# Patient Record
Sex: Male | Born: 1950 | Race: White | Hispanic: No | Smoking: Never smoker
Health system: Southern US, Community
[De-identification: ages and names within clinical notes are randomized; demographics above are authoritative.]

## PROBLEM LIST (undated history)

## (undated) DIAGNOSIS — T7840XA Allergy, unspecified, initial encounter: Secondary | ICD-10-CM

## (undated) DIAGNOSIS — J45909 Unspecified asthma, uncomplicated: Secondary | ICD-10-CM

## (undated) DIAGNOSIS — C4490 Unspecified malignant neoplasm of skin, unspecified: Secondary | ICD-10-CM

## (undated) DIAGNOSIS — F419 Anxiety disorder, unspecified: Secondary | ICD-10-CM

## (undated) DIAGNOSIS — Z5189 Encounter for other specified aftercare: Secondary | ICD-10-CM

## (undated) DIAGNOSIS — H269 Unspecified cataract: Secondary | ICD-10-CM

## (undated) HISTORY — DX: Anxiety disorder, unspecified: F41.9

## (undated) HISTORY — DX: Unspecified malignant neoplasm of skin, unspecified: C44.90

## (undated) HISTORY — DX: Encounter for other specified aftercare: Z51.89

## (undated) HISTORY — PX: SPINE SURGERY: SHX786

## (undated) HISTORY — DX: Unspecified cataract: H26.9

## (undated) HISTORY — DX: Allergy, unspecified, initial encounter: T78.40XA

## (undated) HISTORY — DX: Unspecified asthma, uncomplicated: J45.909

---

## 1992-02-05 HISTORY — PX: MOHS SURGERY: SUR867

## 2000-09-04 ENCOUNTER — Emergency Department (HOSPITAL_COMMUNITY): Admission: EM | Admit: 2000-09-04 | Discharge: 2000-09-04 | Payer: Self-pay | Admitting: Emergency Medicine

## 2000-09-04 ENCOUNTER — Encounter: Payer: Self-pay | Admitting: Emergency Medicine

## 2001-02-12 ENCOUNTER — Encounter: Admission: RE | Admit: 2001-02-12 | Discharge: 2001-02-12 | Payer: Self-pay | Admitting: Internal Medicine

## 2001-05-18 ENCOUNTER — Ambulatory Visit (HOSPITAL_COMMUNITY): Admission: RE | Admit: 2001-05-18 | Discharge: 2001-05-18 | Payer: Self-pay | Admitting: Internal Medicine

## 2013-09-11 ENCOUNTER — Telehealth: Payer: Self-pay | Admitting: Internal Medicine

## 2013-09-11 NOTE — Telephone Encounter (Signed)
Jason Blevins called and stated that he was a friend of Dr. Laney Pastor, and he wanted to leave his number for him.  His call back number is [phone].

## 2013-10-01 ENCOUNTER — Other Ambulatory Visit: Payer: Self-pay | Admitting: Internal Medicine

## 2013-10-01 ENCOUNTER — Ambulatory Visit (INDEPENDENT_AMBULATORY_CARE_PROVIDER_SITE_OTHER): Payer: Managed Care, Other (non HMO) | Admitting: Internal Medicine

## 2013-10-01 VITALS — BP 124/86 | HR 52 | Temp 98.0°F | Resp 16 | Ht 77.0 in | Wt 167.4 lb

## 2013-10-01 DIAGNOSIS — Z1211 Encounter for screening for malignant neoplasm of colon: Secondary | ICD-10-CM

## 2013-10-01 DIAGNOSIS — Z125 Encounter for screening for malignant neoplasm of prostate: Secondary | ICD-10-CM

## 2013-10-01 DIAGNOSIS — Z13220 Encounter for screening for lipoid disorders: Secondary | ICD-10-CM

## 2013-10-01 DIAGNOSIS — Z23 Encounter for immunization: Secondary | ICD-10-CM

## 2013-10-01 DIAGNOSIS — Z0000 Encounter for general adult medical examination without abnormal findings: Secondary | ICD-10-CM

## 2013-10-01 LAB — IFOBT (OCCULT BLOOD): IFOBT: NEGATIVE

## 2013-10-01 NOTE — Progress Notes (Addendum)
Subjective:  This chart was scribed for Tami Lin, MD, by Neta Ehlers, ED Scribe. This patient's care was started at 6:08 PM.    Patient ID: Jason Blevins, male    DOB: [birth year], 63 y.o.   MRN: [id number]  Chief Complaint  Patient presents with  . Annual Exam   HPI  Jason Blevins is a 63 y.o. male who presents to [facility] requesting an annual exam. He denies an annual exam in the approximately 12 years. He has applied to Medicare. Just moved back from [place].  He endorses intermittent chronic back pain. He had spinal surgery 17 years ago; the surgery was performed by Dr. Cheri Rous.   He also endorses occasional difficulty sleeping. [name] reports he takes melatonin nightly and reports he can fall asleep well, but that he may awaken at 2 or 3 in the morning. He states that he reads until he can return to sleep.   He has not had a colonoscopy. He is unsure of his last prostrate screen. He has not had pneumonia or influenza vaccinations. He is unsure of his tetanus status as well.   [name] reports he and his wife have recently returned to the area from where they had been residing in [place]. He is relocating because of his granddaughters (his wife's son's children) in the area. 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   He reports his son continues to live in [place] and works as a Optometrist in the [employer]. He is obtaining his Master's in [employer] PolicyThe son recently turned thirty-years old.   Past Medical History  Diagnosis Date  . Blood transfusion without reported diagnosis    Past Surgical History  Procedure Laterality Date  . Spine surgery     No current outpatient prescriptions on file prior to visit.   No current facility-administered medications on file prior to visit.   History   Social History  . Marital Status: Married    Spouse Name: N/A    Number of Children: N/A  . Years of Education: N/A   Occupational  History  . Not on file.   Social History Main Topics  . Smoking status: Never Smoker   . Smokeless tobacco: Not on file  . Alcohol Use: Yes     Comment: 2-3  . Drug Use: No  . Sexual Activity: Yes      Review of Systems  Musculoskeletal: Positive for back pain.  Psychiatric/Behavioral: Positive for sleep disturbance.  All other systems reviewed and are negative.     Objective:   Physical Exam  Nursing note and vitals reviewed. Constitutional: He is oriented to person, place, and time. He appears well-developed and well-nourished. No distress.  HENT:  Head: Normocephalic and atraumatic.  Eyes: Conjunctivae and EOM are normal. PERRLA Neck: Neck supple. No thyromegaly or lymphadenopathy Cardiovascular: Normal rate.  Regular rhythm. No murmur. Pulmonary/Chest: Effort normal. No respiratory distress. Lungs clear  Abdomen supple without organomegaly  Rectal without masses, stool Hemoccult negative, and prostate symmetrical and soft.  Range of motion at all major joints. Straight leg raise negative.  Neurological: He is alert and oriented to person, place, and time. Cranial nerves intact. Romberg negative. Skin: Skin is warm and dry.  Psychiatric: He has a normal mood and affect. His behavior is normal.  Vitals: BP 124/86  Pulse 52  Temp(Src) 98 F (36.7 C) (Oral)  Resp 16  Ht 6\' 5"  (1.956 m)  Wt  167 lb 6.4 oz (75.932 kg)  BMI 19.85 kg/m2  SpO2 100%     Assessment & Plan:  I have completed the patient encounter in its entirety as documented by the scribe, with editing by me where necessary. Eily Louvier P. Laney Pastor, M.D. Routine general medical examination at a health care facility - Plan: CBC, Lipid panel, PSA, Comprehensive metabolic panel, IFOBT POC (occult bld, rslt in office), Flu Vaccine QUAD 36+ mos IM, Tdap vaccine greater than or equal to 7yo IM, Pneumococcal conjugate vaccine 13-valent IM  Screening for prostate cancer - Plan: PSA  Screening for colon cancer -  Plan: IFOBT POC (occult bld, rslt in office)  Screening for cholesterol level - Plan: Lipid panel  Need for Tdap vaccination - Plan: Tdap vaccine greater than or equal to 7yo IM  Needs flu shot - Plan: Flu Vaccine QUAD 36+ mos IM   Very healthy in general  Addend 8/31 Results for orders placed in visit on 10/01/13  CBC      Result Value Ref Range   WBC 7.9  4.0 - 10.5 K/uL   RBC 5.37  4.22 - 5.81 MIL/uL   Hemoglobin 15.9  13.0 - 17.0 g/dL   HCT 45.6  39.0 - 52.0 %   MCV 84.9  78.0 - 100.0 fL   MCH 29.6  26.0 - 34.0 pg   MCHC 34.9  30.0 - 36.0 g/dL   RDW 13.5  11.5 - 15.5 %   Platelets 250  150 - 400 K/uL  LIPID PANEL      Result Value Ref Range   Cholesterol 210 (*) 0 - 200 mg/dL   Triglycerides 66  <150 mg/dL   HDL 84  >39 mg/dL   Total CHOL/HDL Ratio 2.5     VLDL 13  0 - 40 mg/dL   LDL Cholesterol 113 (*) 0 - 99 mg/dL  PSA      Result Value Ref Range   PSA    <=4.00 ng/mL  COMPREHENSIVE METABOLIC PANEL      Result Value Ref Range   Sodium 136  135 - 145 mEq/L   Potassium 4.1  3.5 - 5.3 mEq/L   Chloride 100  96 - 112 mEq/L   CO2 30  19 - 32 mEq/L   Glucose, Bld 90  70 - 99 mg/dL   BUN 20  6 - 23 mg/dL   Creat 0.96  0.50 - 1.35 mg/dL   Total Bilirubin 0.5  0.2 - 1.2 mg/dL   Alkaline Phosphatase 82  39 - 117 U/L   AST 43 (*) 0 - 37 U/L   ALT 44  0 - 53 U/L   Total Protein 7.8  6.0 - 8.3 g/dL   Albumin 4.7  3.5 - 5.2 g/dL   Calcium 9.9  8.4 - 10.5 mg/dL  IFOBT (OCCULT BLOOD)      Result Value Ref Range   IFOBT Negative     Will add hep c ab Refer for routine colonoscopy next year

## 2013-10-02 LAB — LIPID PANEL
Cholesterol: 210 mg/dL — ABNORMAL HIGH (ref 0–200)
HDL: 84 mg/dL (ref >39)
LDL Cholesterol: 113 mg/dL — ABNORMAL HIGH (ref 0–99)
Total CHOL/HDL Ratio: 2.5 Ratio
Triglycerides: 66 mg/dL (ref <150)
VLDL: 13 mg/dL (ref 0–40)

## 2013-10-02 LAB — COMPREHENSIVE METABOLIC PANEL
ALT: 44 U/L (ref 0–53)
AST: 43 U/L — ABNORMAL HIGH (ref 0–37)
Albumin: 4.7 g/dL (ref 3.5–5.2)
Alkaline Phosphatase: 82 U/L (ref 39–117)
BUN: 20 mg/dL (ref 6–23)
CO2: 30 mEq/L (ref 19–32)
Calcium: 9.9 mg/dL (ref 8.4–10.5)
Chloride: 100 mEq/L (ref 96–112)
Creat: 0.96 mg/dL (ref 0.50–1.35)
Glucose, Bld: 90 mg/dL (ref 70–99)
Potassium: 4.1 mEq/L (ref 3.5–5.3)
Sodium: 136 mEq/L (ref 135–145)
Total Bilirubin: 0.5 mg/dL (ref 0.2–1.2)
Total Protein: 7.8 g/dL (ref 6.0–8.3)

## 2013-10-02 LAB — CBC
HCT: 45.6 % (ref 39.0–52.0)
Hemoglobin: 15.9 g/dL (ref 13.0–17.0)
MCH: 29.6 pg (ref 26.0–34.0)
MCHC: 34.9 g/dL (ref 30.0–36.0)
MCV: 84.9 fL (ref 78.0–100.0)
Platelets: 250 10*3/uL (ref 150–400)
RBC: 5.37 MIL/uL (ref 4.22–5.81)
RDW: 13.5 % (ref 11.5–15.5)
WBC: 7.9 10*3/uL (ref 4.0–10.5)

## 2013-10-04 LAB — PSA: PSA: 0.85 ng/mL (ref <=4.00)

## 2013-10-05 LAB — HEPATITIS C ANTIBODY: HCV AB: NEGATIVE

## 2013-10-06 ENCOUNTER — Encounter: Payer: Self-pay | Admitting: Internal Medicine

## 2014-01-12 DIAGNOSIS — R5383 Other fatigue: Secondary | ICD-10-CM | POA: Diagnosis not present

## 2014-10-21 ENCOUNTER — Ambulatory Visit (INDEPENDENT_AMBULATORY_CARE_PROVIDER_SITE_OTHER): Payer: Medicare Other | Admitting: Internal Medicine

## 2014-10-21 ENCOUNTER — Encounter: Payer: Self-pay | Admitting: Internal Medicine

## 2014-10-21 VITALS — BP 105/69 | HR 59 | Temp 98.6°F | Resp 16 | Ht 77.0 in | Wt 172.0 lb

## 2014-10-21 DIAGNOSIS — Z23 Encounter for immunization: Secondary | ICD-10-CM | POA: Diagnosis not present

## 2014-10-21 DIAGNOSIS — B070 Plantar wart: Secondary | ICD-10-CM

## 2014-10-21 DIAGNOSIS — L299 Pruritus, unspecified: Secondary | ICD-10-CM

## 2014-10-21 MED ORDER — ZOSTER VACCINE LIVE 19400 UNT/0.65ML ~~LOC~~ SOLR: 0.6500 mL | Freq: Once | SUBCUTANEOUS | Status: DC

## 2014-10-21 MED ORDER — IMIQUIMOD 5 % EX CREA: TOPICAL_CREAM | CUTANEOUS | Status: DC

## 2014-10-23 NOTE — Progress Notes (Signed)
   Subjective:    Patient ID: Jason Blevins, male    DOB: 1950/05/29, 64 y.o.   MRN: [id number]  HPI 2 problems #1 pruritus of right upper arm. This comes and goes without obvious skin lesions or without the same factors. Present for at least a year. PH will be deep with intense for several days. There are aspects of this itch that resemble a tingling. He describes no other symptoms of cervical radiculopathy or skin conditions.  #2 plantar wart on foot that has not responded to Compound W although he did not shave dead tissue  Health maintenance issues while he is here include influenza and pneumococcal vaccines and a prescription for shingles vaccine  Review of Systems Noncontributory    Objective:   Physical Exam BP 105/69 mmHg  Pulse 59  Temp(Src) 98.6 F (37 C)  Resp 16  Ht 6\' 5"  (1.956 m)  Wt 172 lb (78.019 kg)  BMI 20.39 kg/m2 There are no skin lesions on the right upper arm and no significant dryness. He does have some tightness and tenderness in his right trapezius in his right paracervical some tension on forced flexion. Reflexes are intact as is motor and sensory exam of the upper extremity     Assessment & Plan:  Problem #1 pruritus-I think he actually has cervical radiculopathy is mild and causes his right upper arm symptoms He will approach with physical therapy\  Problem #2 plantar wart Trial of Aldara 3 times a week for 12 weeks Shave dead tissue between applications   #3 immunizations

## 2015-01-25 ENCOUNTER — Ambulatory Visit (INDEPENDENT_AMBULATORY_CARE_PROVIDER_SITE_OTHER): Payer: Medicare Other | Admitting: Internal Medicine

## 2015-01-25 ENCOUNTER — Encounter: Payer: Self-pay | Admitting: Internal Medicine

## 2015-01-25 VITALS — BP 101/64 | HR 63 | Temp 98.7°F | Resp 16 | Ht 77.0 in | Wt 169.0 lb

## 2015-01-25 DIAGNOSIS — J0100 Acute maxillary sinusitis, unspecified: Secondary | ICD-10-CM

## 2015-01-25 DIAGNOSIS — B070 Plantar wart: Secondary | ICD-10-CM

## 2015-01-25 MED ORDER — AMOXICILLIN 875 MG PO TABS: 875.0000 mg | ORAL_TABLET | Freq: Two times a day (BID) | ORAL | Status: DC

## 2015-01-25 MED ORDER — PREDNISONE 20 MG PO TABS: ORAL_TABLET | ORAL | Status: DC

## 2015-01-25 MED ORDER — HYDROCODONE-ACETAMINOPHEN 5-325 MG PO TABS: 1.0000 | ORAL_TABLET | Freq: Four times a day (QID) | ORAL | Status: DC | PRN

## 2015-01-25 MED ORDER — IMIQUIMOD 5 % EX CREA: TOPICAL_CREAM | CUTANEOUS | Status: DC

## 2015-01-25 NOTE — Progress Notes (Signed)
   Subjective:    Patient ID: Jason Blevins, male    DOB: 05/08/1950, 64 y.o.   MRN: [id number]  HPI  Chief Complaint  Patient presents with  . Follow-up  . Plantar Warts---see last ov improving on aldara at 86mos  . Cough  . Nasal Congestion  Uri sxt 2 mos //worse last week w/ chills ?fever and ourulent d/c //sl cough  Also c/o occas back pain post surgery --still playing BB Uses HC about 1-2x per month when very uncomf   Review of Systems    [place] Objective:   Physical Exam  BP 101/64 mmHg  Pulse 63  Temp(Src) 98.7 F (37.1 C)  Resp 16  Ht 6\' 5"  (1.956 m)  Wt 169 lb (76.658 kg)  BMI 20.04 kg/m2 HEENT--purulent sinus d/c thr clear No nodes Chest clear x sl wheeze with FExpir ant(hx childhood asthma) PL wart R foot smaller!      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 Hydrocodone-Acetaminophen 5-300 MG TABS    Sig: Take by mouth.  . imiquimod (ALDARA) 5 % cream    Sig: Apply topically 3 (three) times a week.    Dispense:  12 each    Refill:  2  . amoxicillin (AMOXIL) 875 MG tablet    Sig: Take 1 tablet (875 mg total) by mouth 2 (two) times daily.    Dispense:  20 tablet    Refill:  0  . predniSONE (DELTASONE) 20 MG tablet    Sig: 3/3/2/2/1/1 single daily dose for 6 days    Dispense:  12 tablet    Refill:  0  . HYDROcodone-acetaminophen (NORCO/VICODIN) 5-325 MG tablet    Sig: Take 1 tablet by mouth every 6 (six) hours as needed for moderate pain.    Dispense:  90 tablet    Refill:  0

## 2015-01-25 NOTE — Patient Instructions (Signed)
Tumeric capsules Cherry extract Glucosamine

## 2015-03-27 DIAGNOSIS — H1013 Acute atopic conjunctivitis, bilateral: Secondary | ICD-10-CM | POA: Diagnosis not present

## 2015-03-28 DIAGNOSIS — H1013 Acute atopic conjunctivitis, bilateral: Secondary | ICD-10-CM | POA: Diagnosis not present

## 2015-08-22 ENCOUNTER — Telehealth: Payer: Self-pay

## 2015-08-22 NOTE — Telephone Encounter (Signed)
Patient needs referrral to  [facility]  [phone]

## 2015-08-22 NOTE — Telephone Encounter (Signed)
Informed pt he needs to be seen for referral, pt has not been seen sine 2016

## 2015-08-23 ENCOUNTER — Ambulatory Visit (INDEPENDENT_AMBULATORY_CARE_PROVIDER_SITE_OTHER): Payer: Medicare Other | Admitting: Emergency Medicine

## 2015-08-23 VITALS — BP 118/82 | HR 53 | Temp 97.8°F | Resp 18 | Ht 77.0 in | Wt 168.0 lb

## 2015-08-23 DIAGNOSIS — H9193 Unspecified hearing loss, bilateral: Secondary | ICD-10-CM

## 2015-08-23 NOTE — Patient Instructions (Addendum)
IF you received an x-ray today, you will receive an invoice from [facility]. Please contact [facility] at [phone] with questions or concerns regarding your invoice.   IF you received labwork today, you will receive an invoice from [employer]. Please contact [facility] at [phone] with questions or concerns regarding your invoice.   Our billing staff will not be able to assist you with questions regarding bills from these companies.  You will be contacted with the lab results as soon as they are available. The fastest way to get your results is to activate your My Chart account. Instructions are located on the last page of this paperwork. If you have not heard from [place] regarding the results in 2 weeks, please contact this office.     Hearing Loss Hearing loss is a partial or total loss of the ability to hear. This can be temporary or permanent, and it can happen in one or both ears. Hearing loss may be referred to as deafness. Medical care is necessary to treat hearing loss properly and to prevent the condition from getting worse. Your hearing may partially or completely come back, depending on what caused your hearing loss and how severe it is. In some cases, hearing loss is permanent. CAUSES Common causes of hearing loss include:   Too much wax in the ear canal.   Infection of the ear canal or middle ear.   Fluid in the middle ear.   Injury to the ear or surrounding area.   An object stuck in the ear.   Prolonged exposure to loud sounds, such as music.  Less common causes of hearing loss include:   Tumors in the ear.   Viral or bacterial infections, such as meningitis.   A hole in the eardrum (perforated eardrum).  Problems with the hearing nerve that sends signals between the brain and the ear.  Certain medicines.  SYMPTOMS  Symptoms of this condition may include:  Difficulty telling the difference  between sounds.  Difficulty following a conversation when there is background noise.  Lack of response to sounds in your environment. This may be most noticeable when you do not respond to startling sounds.  Needing to turn up the volume on the television, radio, etc.  Ringing in the ears.  Dizziness.  Pain in the ears. DIAGNOSIS This condition is diagnosed based on a physical exam and a hearing test (audiometry). The audiometry test will be performed by a hearing specialist (audiologist). You may also be referred to an ear, nose, and throat (ENT) specialist (otolaryngologist).  TREATMENT Treatment for recent onset of hearing loss may include:   Ear wax removal.   Being prescribed medicines to prevent infection (antibiotics).   Being prescribed medicines to reduce inflammation (corticosteroids).  HOME CARE INSTRUCTIONS  If you were prescribed an antibiotic medicine, take it as told by your health care provider. Do not stop taking the antibiotic even if you start to feel better.  Take over-the-counter and prescription medicines only as told by your health care provider.  Avoid loud noises.   Return to your normal activities as told by your health care provider. Ask your health care provider what activities are safe for you.  Keep all follow-up visits as told by your health care provider. This is important. SEEK MEDICAL CARE IF:   You feel dizzy.   You develop new symptoms.   You vomit or feel nauseous.   You have a fever.  SEEK IMMEDIATE  MEDICAL CARE IF:  You develop sudden changes in your vision.   You have severe ear pain.   You have new or increased weakness.  You have a severe headache.   This information is not intended to replace advice given to you by your health care provider. Make sure you discuss any questions you have with your health care provider.   Document Released: 01/21/2005 Document Revised: 10/12/2014 Document Reviewed:  06/08/2014 Elsevier Interactive Patient Education [employer].

## 2015-08-23 NOTE — Progress Notes (Signed)
By signing my name below, I, Mesha Guinyard, attest that this documentation has been prepared under the direction and in the presence of Arlyss Queen, MD.  Electronically Signed: Verlee Monte, Medical Scribe. 08/23/2015. 10:16 AM.  Chief Complaint:  Chief Complaint  Patient presents with  . Referral    Ear issue    HPI: Jason Blevins is a 65 y.o. male who reports to [facility] today complaining of hearing loss that's occured over a period of time. Pt noticed the music isn't a sharp as it once was. Pt has a hard time listening to lyrics when he's listening to music. Pt states his wife complains about him not listening when he says he is. Pt listened to a lot of loud music when he was younger. Pt would like to check his hering baseline.  Past Medical History  Diagnosis Date  . Blood transfusion without reported diagnosis    Past Surgical History  Procedure Laterality Date  . Spine surgery     Social History   Social History  . Marital Status: Married    Spouse Name: N/A  . Number of Children: N/A  . Years of Education: N/A   Social History Main Topics  . Smoking status: Never Smoker   . Smokeless tobacco: None  . Alcohol Use: Yes     Comment: 2-3  . Drug Use: No  . Sexual Activity: Yes   Other Topics Concern  . None   Social History Narrative   Family History  Problem Relation Age of Onset  . Cancer Mother   . Heart disease Maternal Grandfather    No Known Allergies Prior to Admission medications   Medication Sig Start Date End Date Taking? Authorizing Provider  HYDROcodone-acetaminophen (NORCO/VICODIN) 5-325 MG tablet Take 1 tablet by mouth every 6 (six) hours as needed for moderate pain. Patient not taking: Reported on 08/23/2015 01/25/15   Leandrew Koyanagi, MD  Hydrocodone-Acetaminophen 5-300 MG TABS Take by mouth. Reported on 08/23/2015    Historical Provider, MD  predniSONE (DELTASONE) 20 MG tablet 3/3/2/2/1/1 single daily dose for 6 days Patient not taking:  Reported on 08/23/2015 01/25/15   Leandrew Koyanagi, MD     ROS: The patient denies fevers, chills, night sweats, unintentional weight loss, chest pain, palpitations, wheezing, dyspnea on exertion, nausea, vomiting, abdominal pain, dysuria, hematuria, melena, numbness, weakness, or tingling.  All other systems have been reviewed and were otherwise negative with the exception of those mentioned in the HPI and as above.    PHYSICAL EXAM: Filed Vitals:   08/23/15 1008  BP: 118/82  Pulse: 53  Temp: 97.8 F (36.6 C)  Resp: 18   Body mass index is 19.92 kg/(m^2).   General: Alert, no acute distress HEENT:  Normocephalic, atraumatic, oropharynx patent. Eye: Juliette Mangle [facility] Cardiovascular:  Regular rate and rhythm, no rubs murmurs or gallops.  No Carotid bruits, radial pulse intact. No pedal edema.  Respiratory: Clear to auscultation bilaterally.  No wheezes, rales, or rhonchi.  No cyanosis, no use of accessory musculature Abdominal: No organomegaly, abdomen is soft and non-tender, positive bowel sounds.  No masses. Musculoskeletal: Gait intact. No edema, tenderness Skin: No rashes. Neurologic: Facial musculature symmetric. Psychiatric: Patient acts appropriately throughout our interaction. Lymphatic: No cervical or submandibular lymphadenopathy  LABS:  EKG/XRAY:   Primary read interpreted by [facility]. Everlene Farrier at [facility]   ASSESSMENT/PLAN: Patient has had a gradual decline in hearing according to light. Referral made to Dr. Elwyn Reach for audiological evaluation.I personally performed the services  described in this documentation, which was scribed in my presence. The recorded information has been reviewed and is accurate.   Gross sideeffects, risk and benefits, and alternatives of medications d/w patient. Patient is aware that all medications have potential sideeffects and we are unable to predict every sideeffect or drug-drug interaction that may occur.  Arlyss Queen MD 08/23/2015 10:16  AM

## 2015-09-18 DIAGNOSIS — H903 Sensorineural hearing loss, bilateral: Secondary | ICD-10-CM | POA: Diagnosis not present

## 2016-06-27 DIAGNOSIS — H2513 Age-related nuclear cataract, bilateral: Secondary | ICD-10-CM | POA: Diagnosis not present

## 2016-07-03 ENCOUNTER — Ambulatory Visit (INDEPENDENT_AMBULATORY_CARE_PROVIDER_SITE_OTHER): Payer: Medicare Other | Admitting: Emergency Medicine

## 2016-07-03 ENCOUNTER — Encounter: Payer: Self-pay | Admitting: Emergency Medicine

## 2016-07-03 VITALS — BP 128/73 | HR 60 | Temp 98.6°F | Resp 18 | Ht 71.26 in | Wt 169.0 lb

## 2016-07-03 DIAGNOSIS — M545 Low back pain: Secondary | ICD-10-CM

## 2016-07-03 DIAGNOSIS — G8929 Other chronic pain: Secondary | ICD-10-CM

## 2016-07-03 MED ORDER — HYDROCODONE-ACETAMINOPHEN 5-325 MG PO TABS: 1.0000 | ORAL_TABLET | Freq: Four times a day (QID) | ORAL | 0 refills | Status: DC | PRN

## 2016-07-03 NOTE — Patient Instructions (Addendum)
     IF you received an x-ray today, you will receive an invoice from [facility]. Please contact [facility] at [phone] with questions or concerns regarding your invoice.   IF you received labwork today, you will receive an invoice from [place]. Please contact [facility] at [phone] with questions or concerns regarding your invoice.   Our billing staff will not be able to assist you with questions regarding bills from these companies.  You will be contacted with the lab results as soon as they are available. The fastest way to get your results is to activate your My Chart account. Instructions are located on the last page of this paperwork. If you have not heard from [place] regarding the results in 2 weeks, please contact this office.      Back Pain, Adult Back pain is very common. The pain often gets better over time. The cause of back pain is usually not dangerous. Most people can learn to manage their back pain on their own. Follow these instructions at home: Watch your back pain for any changes. The following actions may help to lessen any pain you are feeling:  Stay active. Start with short walks on flat ground if you can. Try to walk farther each day.  Exercise regularly as told by your doctor. Exercise helps your back heal faster. It also helps avoid future injury by keeping your muscles strong and flexible.  Do not sit, drive, or stand in one place for more than 30 minutes.  Do not stay in bed. Resting more than 1-2 days can slow down your recovery.  Be careful when you bend or lift an object. Use good form when lifting:  Bend at your knees.  Keep the object close to your body.  Do not twist.  Sleep on a firm mattress. Lie on your side, and bend your knees. If you lie on your back, put a pillow under your knees.  Take medicines only as told by your doctor.  Put ice on the injured area.  Put ice in a plastic bag.  Place a towel between your  skin and the bag.  Leave the ice on for 20 minutes, 2-3 times a day for the first 2-3 days. After that, you can switch between ice and heat packs.  Avoid feeling anxious or stressed. Find good ways to deal with stress, such as exercise.  Maintain a healthy weight. Extra weight puts stress on your back. Contact a doctor if:  You have pain that does not go away with rest or medicine.  You have worsening pain that goes down into your legs or buttocks.  You have pain that does not get better in one week.  You have pain at night.  You lose weight.  You have a fever or chills. Get help right away if:  You cannot control when you poop (bowel movement) or pee (urinate).  Your arms or legs feel weak.  Your arms or legs lose feeling (numbness).  You feel sick to your stomach (nauseous) or throw up (vomit).  You have belly (abdominal) pain.  You feel like you may pass out (faint). This information is not intended to replace advice given to you by your health care provider. Make sure you discuss any questions you have with your health care provider. Document Released: 07/10/2007 Document Revised: 06/29/2015 Document Reviewed: 05/25/2013 Elsevier Interactive Patient Education  2017 [employer].

## 2016-07-03 NOTE — Progress Notes (Signed)
Jason Blevins 66 y.o.   Chief Complaint  Patient presents with  . Follow-up    Back pain    HISTORY OF PRESENT ILLNESS: This is a 66 y.o. male complaining of chronic back pain that occassionally flares up; plays basketball regularly; sometimes he takes Norco but not regularly. Denies neurological symptoms.  Back Pain  This is a chronic problem. The current episode started in the past 7 days. The problem occurs intermittently. The problem has been waxing and waning since onset. The pain is present in the lumbar spine. The quality of the pain is described as aching. The pain does not radiate. The pain is at a severity of 5/10. The pain is moderate. The symptoms are aggravated by bending and position. Pertinent negatives include no abdominal pain, bladder incontinence, bowel incontinence, chest pain, dysuria, fever, headaches, leg pain, numbness, paresis, paresthesias, pelvic pain, perianal numbness, tingling, weakness or weight loss. Risk factors: none.     Prior to Admission medications   Medication Sig Start Date End Date Taking? Authorizing Provider  HYDROcodone-acetaminophen (NORCO/VICODIN) 5-325 MG tablet Take 1 tablet by mouth every 6 (six) hours as needed for moderate pain. 01/25/15  Yes Leandrew Koyanagi, MD  predniSONE (DELTASONE) 20 MG tablet 3/3/2/2/1/1 single daily dose for 6 days 01/25/15  Yes Leandrew Koyanagi, MD    No Known Allergies  There are no active problems to display for this patient.   Past Medical History:  Diagnosis Date  . Blood transfusion without reported diagnosis     Past Surgical History:  Procedure Laterality Date  . SPINE SURGERY      Social History   Social History  . Marital status: Married    Spouse name: N/A  . Number of children: N/A  . Years of education: N/A   Occupational History  . Not on file.   Social History Main Topics  . Smoking status: Never Smoker  . Smokeless tobacco: Never Used  . Alcohol use Yes     Comment:  2-3  . Drug use: No  . Sexual activity: Yes   Other Topics Concern  . Not on file   Social History Narrative  . No narrative on file    Family History  Problem Relation Age of Onset  . Cancer Mother   . Heart disease Maternal Grandfather      Review of Systems  Constitutional: Negative for fever and weight loss.  HENT: Positive for hearing loss. Negative for congestion, ear pain, nosebleeds, sinus pain and sore throat.   Eyes: Negative for blurred vision, double vision, discharge and redness.  Respiratory: Negative for cough, hemoptysis and shortness of breath.   Cardiovascular: Negative for chest pain, palpitations and leg swelling.  Gastrointestinal: Negative for abdominal pain, bowel incontinence, diarrhea, nausea and vomiting.  Genitourinary: Negative for bladder incontinence, dysuria and pelvic pain.  Musculoskeletal: Positive for back pain.  Skin: Negative for rash.  Neurological: Positive for dizziness (intermittent). Negative for tingling, sensory change, speech change, focal weakness, seizures, loss of consciousness, weakness, numbness, headaches and paresthesias.  Endo/Heme/Allergies: Negative.   All other systems reviewed and are negative.  Vitals:   07/03/16 0814  BP: 128/73  Pulse: 60  Resp: 18  Temp: 98.6 F (37 C)     Physical Exam  Constitutional: He is oriented to person, place, and time. He appears well-developed and well-nourished.  HENT:  Head: Normocephalic and atraumatic.  Right Ear: Tympanic membrane, external ear and ear canal normal.  Left Ear: Tympanic membrane,  external ear and ear canal normal.  Nose: Nose normal.  Mouth/Throat: Oropharynx is clear and moist. No oropharyngeal exudate.  Eyes: Conjunctivae and EOM are normal. Pupils are equal, round, and reactive to light.  Neck: Normal range of motion. Neck supple. No JVD present. No thyromegaly present.  Cardiovascular: Normal rate, regular rhythm, normal heart sounds and intact distal  pulses.   Pulmonary/Chest: Effort normal and breath sounds normal.  Abdominal: Soft. Bowel sounds are normal. There is no tenderness.  Musculoskeletal: Normal range of motion.  Lymphadenopathy:    He has no cervical adenopathy.  Neurological: He is alert and oriented to person, place, and time. No sensory deficit. He exhibits normal muscle tone.  Skin: Skin is warm and dry. Capillary refill takes less than 2 seconds. No rash noted.  Psychiatric: He has a normal mood and affect. His behavior is normal.  Vitals reviewed.    ASSESSMENT & PLAN: Jason Blevins was seen today for follow-up.  Diagnoses and all orders for this visit:  Chronic right-sided low back pain without sciatica  Other orders -     HYDROcodone-acetaminophen (NORCO) 5-325 MG tablet; Take 1 tablet by mouth every 6 (six) hours as needed for moderate pain.    Patient Instructions       IF you received an x-ray today, you will receive an invoice from [facility]. Please contact [facility] at [phone] with questions or concerns regarding your invoice.   IF you received labwork today, you will receive an invoice from [facility]. Please contact [facility] at [phone] with questions or concerns regarding your invoice.   Our billing staff will not be able to assist you with questions regarding bills from these companies.  You will be contacted with the lab results as soon as they are available. The fastest way to get your results is to activate your My Chart account. Instructions are located on the last page of this paperwork. If you have not heard from [place] regarding the results in 2 weeks, please contact this office.      Back Pain, Adult Back pain is very common. The pain often gets better over time. The cause of back pain is usually not dangerous. Most people can learn to manage their back pain on their own. Follow these instructions at home: Watch your back pain for any changes. The following actions  may help to lessen any pain you are feeling:  Stay active. Start with short walks on flat ground if you can. Try to walk farther each day.  Exercise regularly as told by your doctor. Exercise helps your back heal faster. It also helps avoid future injury by keeping your muscles strong and flexible.  Do not sit, drive, or stand in one place for more than 30 minutes.  Do not stay in bed. Resting more than 1-2 days can slow down your recovery.  Be careful when you bend or lift an object. Use good form when lifting:  Bend at your knees.  Keep the object close to your body.  Do not twist.  Sleep on a firm mattress. Lie on your side, and bend your knees. If you lie on your back, put a pillow under your knees.  Take medicines only as told by your doctor.  Put ice on the injured area.  Put ice in a plastic bag.  Place a towel between your skin and the bag.  Leave the ice on for 20 minutes, 2-3 times a day for the first 2-3 days. After that, you  can switch between ice and heat packs.  Avoid feeling anxious or stressed. Find good ways to deal with stress, such as exercise.  Maintain a healthy weight. Extra weight puts stress on your back. Contact a doctor if:  You have pain that does not go away with rest or medicine.  You have worsening pain that goes down into your legs or buttocks.  You have pain that does not get better in one week.  You have pain at night.  You lose weight.  You have a fever or chills. Get help right away if:  You cannot control when you poop (bowel movement) or pee (urinate).  Your arms or legs feel weak.  Your arms or legs lose feeling (numbness).  You feel sick to your stomach (nauseous) or throw up (vomit).  You have belly (abdominal) pain.  You feel like you may pass out (faint). This information is not intended to replace advice given to you by your health care provider. Make sure you discuss any questions you have with your health care  provider. Document Released: 07/10/2007 Document Revised: 06/29/2015 Document Reviewed: 05/25/2013 Elsevier Interactive Patient Education  2017 Elsevier Inc.      Agustina Caroli, MD [facility]

## 2016-08-06 ENCOUNTER — Encounter: Payer: Self-pay | Admitting: Emergency Medicine

## 2016-08-06 ENCOUNTER — Ambulatory Visit (INDEPENDENT_AMBULATORY_CARE_PROVIDER_SITE_OTHER): Payer: Medicare Other | Admitting: Emergency Medicine

## 2016-08-06 VITALS — BP 119/78 | HR 58 | Temp 98.2°F | Resp 18 | Ht 71.93 in | Wt 170.2 lb

## 2016-08-06 DIAGNOSIS — W57XXXA Bitten or stung by nonvenomous insect and other nonvenomous arthropods, initial encounter: Secondary | ICD-10-CM | POA: Diagnosis not present

## 2016-08-06 DIAGNOSIS — L299 Pruritus, unspecified: Secondary | ICD-10-CM | POA: Diagnosis not present

## 2016-08-06 MED ORDER — PREDNISONE 20 MG PO TABS: 40.0000 mg | ORAL_TABLET | Freq: Every day | ORAL | 0 refills | Status: AC

## 2016-08-06 MED ORDER — TRIAMCINOLONE ACETONIDE 0.1 % EX CREA: 1.0000 "application " | TOPICAL_CREAM | Freq: Two times a day (BID) | CUTANEOUS | 0 refills | Status: DC

## 2016-08-06 NOTE — Progress Notes (Signed)
Jason Blevins 66 y.o.   Chief Complaint  Patient presents with  . Insect Bite    X 5 weeks- ithcing    HISTORY OF PRESENT ILLNESS: This is a 66 y.o. male complaining of several insect bites throughout his body x 5 weeks; still itching; no systemic symptoms.  HPI   Prior to Admission medications   Medication Sig Start Date End Date Taking? Authorizing Provider  HYDROcodone-acetaminophen (NORCO) 5-325 MG tablet Take 1 tablet by mouth every 6 (six) hours as needed for moderate pain. 07/03/16  Yes Horald Pollen, MD  predniSONE (DELTASONE) 20 MG tablet 3/3/2/2/1/1 single daily dose for 6 days Patient not taking: Reported on 08/06/2016 01/25/15   Leandrew Koyanagi, MD    No Known Allergies  Patient Active Problem List   Diagnosis Date Noted  . Chronic right-sided low back pain without sciatica 07/03/2016    Past Medical History:  Diagnosis Date  . Blood transfusion without reported diagnosis     Past Surgical History:  Procedure Laterality Date  . SPINE SURGERY      Social History   Social History  . Marital status: Married    Spouse name: N/A  . Number of children: N/A  . Years of education: N/A   Occupational History  . Not on file.   Social History Main Topics  . Smoking status: Never Smoker  . Smokeless tobacco: Never Used  . Alcohol use Yes     Comment: 2-3  . Drug use: No  . Sexual activity: Yes   Other Topics Concern  . Not on file   Social History Narrative  . No narrative on file    Family History  Problem Relation Age of Onset  . Cancer Mother   . Heart disease Maternal Grandfather      Review of Systems  Constitutional: Negative for chills and fever.  HENT: Negative.  Negative for sore throat.   Eyes: Negative.  Negative for blurred vision and double vision.  Respiratory: Negative.  Negative for shortness of breath and wheezing.   Cardiovascular: Negative.  Negative for chest pain.  Gastrointestinal: Negative.  Negative for  abdominal pain, diarrhea, nausea and vomiting.  Musculoskeletal: Negative for joint pain.  Skin: Positive for itching and rash.  Neurological: Negative for dizziness, sensory change, focal weakness and headaches.  Endo/Heme/Allergies: Negative.   All other systems reviewed and are negative.  Vitals:   08/06/16 0838  BP: 119/78  Pulse: (!) 58  Resp: 18  Temp: 98.2 F (36.8 C)     Physical Exam  Constitutional: He is oriented to person, place, and time. He appears well-developed and well-nourished.  HENT:  Head: Normocephalic and atraumatic.  Eyes: EOM are normal. Pupils are equal, round, and reactive to light.  Neck: Normal range of motion. Neck supple.  Pulmonary/Chest: Effort normal.  Neurological: He is alert and oriented to person, place, and time.  Skin: Capillary refill takes less than 2 seconds. Rash noted.  Several insect bite areas, right leg, left buttock; no sign of infection.  Psychiatric: He has a normal mood and affect. His behavior is normal.  Vitals reviewed.    ASSESSMENT & PLAN: [name] was seen today for insect bite.  Diagnoses and all orders for this visit:  Itching  Insect bite, initial encounter  Other orders -     triamcinolone cream (KENALOG) 0.1 %; Apply 1 application topically 2 (two) times daily. -     predniSONE (DELTASONE) 20 MG tablet; Take 2 tablets (  40 mg total) by mouth daily with breakfast.    Patient Instructions       IF you received an x-ray today, you will receive an invoice from [facility]. Please contact [facility] at [phone] with questions or concerns regarding your invoice.   IF you received labwork today, you will receive an invoice from [facility]. Please contact [facility] at [phone] with questions or concerns regarding your invoice.   Our billing staff will not be able to assist you with questions regarding bills from these companies.  You will be contacted with the lab results as soon as  they are available. The fastest way to get your results is to activate your My Chart account. Instructions are located on the last page of this paperwork. If you have not heard from [place] regarding the results in 2 weeks, please contact this office.      Insect Bite, Adult An insect bite can make your skin red, itchy, and swollen. Some insects can spread disease to people with a bite. However, most insect bites do not lead to disease, and most are not serious. Follow these instructions at home: Bite area care  Do not scratch the bite area.  Keep the bite area clean and dry.  Wash the bite area every day with soap and water as told by your doctor.  Check the bite area every day for signs of infection. Check for: ? More redness, swelling, or pain. ? Fluid or blood. ? Warmth. ? Pus. Managing pain, itching, and swelling  You may put any of these on the bite area as told by your doctor: ? A baking soda paste. ? Cortisone cream. ? Calamine lotion.  If directed, put ice on the bite area. ? Put ice in a plastic bag. ? Place a towel between your skin and the bag. ? Leave the ice on for 20 minutes, 2-3 times a day. Medicines  Take medicines or put medicines on your skin only as told by your doctor.  If you were prescribed an antibiotic medicine, use it as told by your doctor. Do not stop using the antibiotic even if your condition improves. General instructions  Keep all follow-up visits as told by your doctor. This is important. How is this prevented? To help you have a lower risk of insect bites:  When you are outside, wear clothing that covers your arms and legs.  Use insect repellent. The best insect repellents have: ? An active ingredient of DEET, picaridin, oil of lemon eucalyptus (OLE), or IR3535. ? Higher amounts of DEET or another active ingredient than other repellents have.  If your home windows do not have screens, think about putting some in.  Contact a doctor  if:  You have more redness, swelling, or pain in the bite area.  You have fluid, blood, or pus coming from the bite area.  The bite area feels warm.  You have a fever. Get help right away if:  You have joint pain.  You have a rash.  You have shortness of breath.  You feel more tired or sleepy than you normally do.  You have neck pain.  You have a headache.  You feel weaker than you normally do.  You have chest pain.  You have pain in your belly.  You feel sick to your stomach (nauseous) or you throw up (vomit). Summary  An insect bite can make your skin red, itchy, and swollen.  Do not scratch the bite area, and keep it  clean and dry.  Ice can help with pain and itching from the bite. This information is not intended to replace advice given to you by your health care provider. Make sure you discuss any questions you have with your health care provider. Document Released: 01/19/2000 Document Revised: 08/24/2015 Document Reviewed: 06/08/2014 Elsevier Interactive Patient Education  2018 Elsevier Inc.      Agustina Caroli, MD [facility]

## 2016-08-06 NOTE — Patient Instructions (Addendum)
   IF you received an x-ray today, you will receive an invoice from [place] [facility]. Please contact  Radiology at [phone] with questions or concerns regarding your invoice.   IF you received labwork today, you will receive an invoice from [facility]. Please contact [facility] at [phone] with questions or concerns regarding your invoice.   Our billing staff will not be able to assist you with questions regarding bills from these companies.  You will be contacted with the lab results as soon as they are available. The fastest way to get your results is to activate your My Chart account. Instructions are located on the last page of this paperwork. If you have not heard from us regarding the results in 2 weeks, please contact this office.      Insect Bite, Adult An insect bite can make your skin red, itchy, and swollen. Some insects can spread disease to people with a bite. However, most insect bites do not lead to disease, and most are not serious. Follow these instructions at home: Bite area care  Do not scratch the bite area.  Keep the bite area clean and dry.  Wash the bite area every day with soap and water as told by your doctor.  Check the bite area every day for signs of infection. Check for: ? More redness, swelling, or pain. ? Fluid or blood. ? Warmth. ? Pus. Managing pain, itching, and swelling  You may put any of these on the bite area as told by your doctor: ? A baking soda paste. ? Cortisone cream. ? Calamine lotion.  If directed, put ice on the bite area. ? Put ice in a plastic bag. ? Place a towel between your skin and the bag. ? Leave the ice on for 20 minutes, 2-3 times a day. Medicines  Take medicines or put medicines on your skin only as told by your doctor.  If you were prescribed an antibiotic medicine, use it as told by your doctor. Do not stop using the antibiotic even if your condition improves. General instructions  Keep all  follow-up visits as told by your doctor. This is important. How is this prevented? To help you have a lower risk of insect bites:  When you are outside, wear clothing that covers your arms and legs.  Use insect repellent. The best insect repellents have: ? An active ingredient of DEET, picaridin, oil of lemon eucalyptus (OLE), or IR3535. ? Higher amounts of DEET or another active ingredient than other repellents have.  If your home windows do not have screens, think about putting some in.  Contact a doctor if:  You have more redness, swelling, or pain in the bite area.  You have fluid, blood, or pus coming from the bite area.  The bite area feels warm.  You have a fever. Get help right away if:  You have joint pain.  You have a rash.  You have shortness of breath.  You feel more tired or sleepy than you normally do.  You have neck pain.  You have a headache.  You feel weaker than you normally do.  You have chest pain.  You have pain in your belly.  You feel sick to your stomach (nauseous) or you throw up (vomit). Summary  An insect bite can make your skin red, itchy, and swollen.  Do not scratch the bite area, and keep it clean and dry.  Ice can help with pain and itching from the bite. This information is   not intended to replace advice given to you by your health care provider. Make sure you discuss any questions you have with your health care provider. Document Released: 01/19/2000 Document Revised: 08/24/2015 Document Reviewed: 06/08/2014 Elsevier Interactive Patient Education  2018 Elsevier Inc.  

## 2016-11-27 ENCOUNTER — Ambulatory Visit (INDEPENDENT_AMBULATORY_CARE_PROVIDER_SITE_OTHER): Payer: Medicare Other | Admitting: Physician Assistant

## 2016-11-27 ENCOUNTER — Encounter: Payer: Self-pay | Admitting: Physician Assistant

## 2016-11-27 VITALS — BP 122/72 | HR 98 | Temp 98.0°F | Resp 17 | Ht 71.0 in | Wt 167.0 lb

## 2016-11-27 DIAGNOSIS — J069 Acute upper respiratory infection, unspecified: Secondary | ICD-10-CM | POA: Diagnosis not present

## 2016-11-27 DIAGNOSIS — Z23 Encounter for immunization: Secondary | ICD-10-CM | POA: Diagnosis not present

## 2016-11-27 MED ORDER — BENZONATATE 100 MG PO CAPS: 100.0000 mg | ORAL_CAPSULE | Freq: Three times a day (TID) | ORAL | 0 refills | Status: DC | PRN

## 2016-11-27 MED ORDER — GUAIFENESIN ER 1200 MG PO TB12
1.0000 | ORAL_TABLET | Freq: Two times a day (BID) | ORAL | 1 refills | Status: DC | PRN
Start: 2016-11-27 — End: 2017-05-31

## 2016-11-27 NOTE — Patient Instructions (Addendum)
Hydrate well with 64oz of water or more.   Please take as prescribed. Use ibuprofen 600mg  every 8 hours for the next 5 days.   Upper Respiratory Infection, Adult Most upper respiratory infections (URIs) are caused by a virus. A URI affects the nose, throat, and upper air passages. The most common type of URI is often called "the common cold." Follow these instructions at home:  Take medicines only as told by your doctor.  Gargle warm saltwater or take cough drops to comfort your throat as told by your doctor.  Use a warm mist humidifier or inhale steam from a shower to increase air moisture. This may make it easier to breathe.  Drink enough fluid to keep your pee (urine) clear or pale yellow.  Eat soups and other clear broths.  Have a healthy diet.  Rest as needed.  Go back to work when your fever is gone or your doctor says it is okay. ? You may need to stay home longer to avoid giving your URI to others. ? You can also wear a face mask and wash your hands often to prevent spread of the virus.  Use your inhaler more if you have asthma.  Do not use any tobacco products, including cigarettes, chewing tobacco, or electronic cigarettes. If you need help quitting, ask your doctor. Contact a doctor if:  You are getting worse, not better.  Your symptoms are not helped by medicine.  You have chills.  You are getting more short of breath.  You have brown or red mucus.  You have yellow or brown discharge from your nose.  You have pain in your face, especially when you bend forward.  You have a fever.  You have puffy (swollen) neck glands.  You have pain while swallowing.  You have white areas in the back of your throat. Get help right away if:  You have very bad or constant: ? Headache. ? Ear pain. ? Pain in your forehead, behind your eyes, and over your cheekbones (sinus pain). ? Chest pain.  You have long-lasting (chronic) lung disease and any of the  following: ? Wheezing. ? Long-lasting cough. ? Coughing up blood. ? A change in your usual mucus.  You have a stiff neck.  You have changes in your: ? Vision. ? Hearing. ? Thinking. ? Mood. This information is not intended to replace advice given to you by your health care provider. Make sure you discuss any questions you have with your health care provider. Document Released: 07/10/2007 Document Revised: 09/24/2015 Document Reviewed: 04/28/2013 Elsevier Interactive Patient Education  2018 [employer].     IF you received an x-ray today, you will receive an invoice from [facility]. Please contact [facility] at [phone] with questions or concerns regarding your invoice.   IF you received labwork today, you will receive an invoice from [facility]. Please contact [facility] at [phone] with questions or concerns regarding your invoice.   Our billing staff will not be able to assist you with questions regarding bills from these companies.  You will be contacted with the lab results as soon as they are available. The fastest way to get your results is to activate your My Chart account. Instructions are located on the last page of this paperwork. If you have not heard from [place] regarding the results in 2 weeks, please contact this office.

## 2016-11-27 NOTE — Progress Notes (Signed)
PRIMARY CARE AT [facility] [address] [place] [address] [phone]  Date:  11/27/2016   Name:  Jason Blevins   DOB:  1950-03-24   MRN:  [id number]  PCP:  Horald Pollen, MD    History of Present Illness:  Jason Blevins is a 66 y.o. male patient who presents to PCP with  Chief Complaint  Patient presents with  . Pneumonia    sob, uri       5 days of cold like symptoms.  Runny nose, sneezing.  Today chest tightness, and coughing.  Non-productive cough, no sob or dyspnea.  No fever.  He is having no nasal congestion, or ear discomfort.  He does respond to the change in seasons.   He is taking a few antihistamines.    Patient Active Problem List   Diagnosis Date Noted  . Bite, insect 08/06/2016  . Itching 08/06/2016  . Chronic right-sided low back pain without sciatica 07/03/2016    Past Medical History:  Diagnosis Date  . Blood transfusion without reported diagnosis     Past Surgical History:  Procedure Laterality Date  . SPINE SURGERY      Social History  Substance Use Topics  . Smoking status: Never Smoker  . Smokeless tobacco: Never Used  . Alcohol use Yes     Comment: 2-3    Family History  Problem Relation Age of Onset  . Cancer Mother   . Heart disease Maternal Grandfather     No Known Allergies  Medication list has been reviewed and updated.  No current outpatient prescriptions on file prior to visit.   No current facility-administered medications on file prior to visit.     ROS ROS otherwise unremarkable unless listed above.  Physical Examination: BP 122/72   Pulse 98   Temp 98 F (36.7 C) (Oral)   Resp 17   Ht 5\' 11"  (1.803 m)   Wt 167 lb (75.8 kg)   SpO2 98%   BMI 23.29 kg/m  Ideal Body Weight: Weight in (lb) to have BMI = 25: 178.9  Physical Exam  Constitutional: He is oriented to person, place, and time. He appears well-developed and well-nourished. No distress.  HENT:  Head: Normocephalic and atraumatic.  Eyes:  Pupils are equal, round, and reactive to light. Conjunctivae and EOM are normal.  Cardiovascular: Normal rate.   Pulmonary/Chest: Effort normal. No respiratory distress.  Neurological: He is alert and oriented to person, place, and time.  Skin: Skin is warm and dry. He is not diaphoretic.  Psychiatric: He has a normal mood and affect. His behavior is normal.     Assessment and Plan: Jason Blevins is a 66 y.o. male who is here today  If there is no improvement within 5 days of his symptoms, fine to give azithromycin in form of zpak Treating supportively at this time.  Advised nsaid for pain and fever. Acute upper respiratory infection - Plan: Guaifenesin (MUCINEX MAXIMUM STRENGTH) 1200 MG TB12, benzonatate (TESSALON) 100 MG capsule  Ivar Drape, PA-C [facility] 10/27/20189:01 AM

## 2016-11-27 NOTE — Progress Notes (Deleted)
PRIMARY CARE AT [facility] [address] [place] [address] [phone]  Date:  11/27/2016   Name:  Jason Blevins   DOB:  Jul 20, 1950   MRN:  [id number]  PCP:  Horald Pollen, MD    History of Present Illness:  Jason Blevins is a 66 y.o. male patient who presents to PCP with  Chief Complaint  Patient presents with  . Pneumonia    sob, uri        Patient Active Problem List   Diagnosis Date Noted  . Bite, insect 08/06/2016  . Itching 08/06/2016  . Chronic right-sided low back pain without sciatica 07/03/2016    Past Medical History:  Diagnosis Date  . Blood transfusion without reported diagnosis     Past Surgical History:  Procedure Laterality Date  . SPINE SURGERY      Social History  Substance Use Topics  . Smoking status: Never Smoker  . Smokeless tobacco: Never Used  . Alcohol use Yes     Comment: 2-3    Family History  Problem Relation Age of Onset  . Cancer Mother   . Heart disease Maternal Grandfather     No Known Allergies  Medication list has been reviewed and updated.  No current outpatient prescriptions on file prior to visit.   No current facility-administered medications on file prior to visit.     ROS ROS otherwise unremarkable unless listed above.  Physical Examination: BP 122/72   Pulse 98   Temp 98 F (36.7 C) (Oral)   Resp 17   Ht 5\' 11"  (1.803 m)   Wt 167 lb (75.8 kg)   SpO2 98%   BMI 23.29 kg/m  Ideal Body Weight: Weight in (lb) to have BMI = 25: 178.9  Physical Exam   Assessment and Plan: Jason Blevins is a 66 y.o. male who is here today  There are no diagnoses linked to this encounter.  Ivar Drape, PA-C [facility] 11/27/2016 3:41 PM

## 2016-12-10 DIAGNOSIS — L570 Actinic keratosis: Secondary | ICD-10-CM | POA: Diagnosis not present

## 2016-12-10 DIAGNOSIS — D485 Neoplasm of uncertain behavior of skin: Secondary | ICD-10-CM | POA: Diagnosis not present

## 2016-12-21 DIAGNOSIS — C4491 Basal cell carcinoma of skin, unspecified: Secondary | ICD-10-CM | POA: Diagnosis not present

## 2016-12-25 DIAGNOSIS — C4491 Basal cell carcinoma of skin, unspecified: Secondary | ICD-10-CM | POA: Diagnosis not present

## 2016-12-31 DIAGNOSIS — L570 Actinic keratosis: Secondary | ICD-10-CM | POA: Diagnosis not present

## 2016-12-31 DIAGNOSIS — C4491 Basal cell carcinoma of skin, unspecified: Secondary | ICD-10-CM | POA: Diagnosis not present

## 2016-12-31 DIAGNOSIS — Z85828 Personal history of other malignant neoplasm of skin: Secondary | ICD-10-CM | POA: Diagnosis not present

## 2017-02-06 DIAGNOSIS — D489 Neoplasm of uncertain behavior, unspecified: Secondary | ICD-10-CM | POA: Diagnosis not present

## 2017-02-06 DIAGNOSIS — Z85828 Personal history of other malignant neoplasm of skin: Secondary | ICD-10-CM | POA: Diagnosis not present

## 2017-02-06 DIAGNOSIS — L570 Actinic keratosis: Secondary | ICD-10-CM | POA: Diagnosis not present

## 2017-02-06 DIAGNOSIS — Z1283 Encounter for screening for malignant neoplasm of skin: Secondary | ICD-10-CM | POA: Diagnosis not present

## 2017-02-11 DIAGNOSIS — E559 Vitamin D deficiency, unspecified: Secondary | ICD-10-CM | POA: Diagnosis not present

## 2017-02-11 DIAGNOSIS — E60 Dietary zinc deficiency: Secondary | ICD-10-CM | POA: Diagnosis not present

## 2017-02-11 DIAGNOSIS — R899 Unspecified abnormal finding in specimens from other organs, systems and tissues: Secondary | ICD-10-CM | POA: Diagnosis not present

## 2017-02-11 DIAGNOSIS — Z8639 Personal history of other endocrine, nutritional and metabolic disease: Secondary | ICD-10-CM | POA: Diagnosis not present

## 2017-02-13 DIAGNOSIS — D099 Carcinoma in situ, unspecified: Secondary | ICD-10-CM | POA: Diagnosis not present

## 2017-02-19 ENCOUNTER — Telehealth: Payer: Self-pay

## 2017-02-19 NOTE — Telephone Encounter (Signed)
Pt needs office visit.   Copied from [facility] [id number]. Topic: Referral - Request >> Feb 19, 2017 12:32 PM Vernona Rieger wrote: Patient needs it sent to the [facility]. He needs this asap as he needs to have new hearing aids ordered. Call back is [phone] Fax number is [phone]

## 2017-02-19 NOTE — Telephone Encounter (Signed)
Called pt to make an appt. Per CRM notes. Left message for him to call and make an OV appt.  Thanks!

## 2017-03-12 DIAGNOSIS — D099 Carcinoma in situ, unspecified: Secondary | ICD-10-CM | POA: Diagnosis not present

## 2017-03-12 DIAGNOSIS — D489 Neoplasm of uncertain behavior, unspecified: Secondary | ICD-10-CM | POA: Diagnosis not present

## 2017-03-12 DIAGNOSIS — Z85828 Personal history of other malignant neoplasm of skin: Secondary | ICD-10-CM | POA: Diagnosis not present

## 2017-03-12 DIAGNOSIS — L570 Actinic keratosis: Secondary | ICD-10-CM | POA: Diagnosis not present

## 2017-05-05 ENCOUNTER — Encounter: Payer: Self-pay | Admitting: Physician Assistant

## 2017-05-05 DIAGNOSIS — L821 Other seborrheic keratosis: Secondary | ICD-10-CM | POA: Diagnosis not present

## 2017-05-05 DIAGNOSIS — L905 Scar conditions and fibrosis of skin: Secondary | ICD-10-CM | POA: Diagnosis not present

## 2017-05-05 DIAGNOSIS — D485 Neoplasm of uncertain behavior of skin: Secondary | ICD-10-CM | POA: Diagnosis not present

## 2017-05-05 DIAGNOSIS — Z85828 Personal history of other malignant neoplasm of skin: Secondary | ICD-10-CM | POA: Diagnosis not present

## 2017-05-20 ENCOUNTER — Ambulatory Visit: Payer: Medicare Other

## 2017-05-27 ENCOUNTER — Ambulatory Visit: Payer: Medicare Other | Admitting: Emergency Medicine

## 2017-05-31 ENCOUNTER — Other Ambulatory Visit: Payer: Self-pay

## 2017-05-31 ENCOUNTER — Encounter: Payer: Self-pay | Admitting: Emergency Medicine

## 2017-05-31 ENCOUNTER — Encounter

## 2017-05-31 ENCOUNTER — Ambulatory Visit (INDEPENDENT_AMBULATORY_CARE_PROVIDER_SITE_OTHER): Payer: Medicare Other | Admitting: Emergency Medicine

## 2017-05-31 VITALS — BP 110/76 | HR 56 | Temp 98.4°F | Resp 16 | Ht 71.0 in | Wt 160.4 lb

## 2017-05-31 DIAGNOSIS — M545 Low back pain, unspecified: Secondary | ICD-10-CM

## 2017-05-31 DIAGNOSIS — Z23 Encounter for immunization: Secondary | ICD-10-CM | POA: Diagnosis not present

## 2017-05-31 DIAGNOSIS — Z0000 Encounter for general adult medical examination without abnormal findings: Secondary | ICD-10-CM | POA: Diagnosis not present

## 2017-05-31 DIAGNOSIS — G8929 Other chronic pain: Secondary | ICD-10-CM

## 2017-05-31 LAB — POCT URINALYSIS DIPSTICK
Bilirubin, UA: NEGATIVE
GLUCOSE UA: NEGATIVE
Leukocytes, UA: NEGATIVE
NITRITE UA: NEGATIVE
PROTEIN UA: NEGATIVE
RBC UA: NEGATIVE
Spec Grav, UA: 1.025 (ref 1.010–1.025)
Urobilinogen, UA: 0.2 E.U./dL
pH, UA: 6.5 (ref 5.0–8.0)

## 2017-05-31 MED ORDER — ZOSTER VAC RECOMB ADJUVANTED 50 MCG/0.5ML IM SUSR: 0.5000 mL | Freq: Once | INTRAMUSCULAR | 0 refills | Status: AC

## 2017-05-31 MED ORDER — HYDROCODONE-ACETAMINOPHEN 5-325 MG PO TABS: 1.0000 | ORAL_TABLET | Freq: Four times a day (QID) | ORAL | 0 refills | Status: DC | PRN

## 2017-05-31 NOTE — Progress Notes (Deleted)
Subjective:   Jason Blevins is a 67 y.o. male who presents for an Initial Medicare Annual Wellness Visit.  Review of Systems  ***      Objective:    Today's Vitals   05/31/17 1016  BP: 110/76  Pulse: (!) 56  Resp: 16  Temp: 98.4 F (36.9 C)  TempSrc: Oral  SpO2: 99%  Weight: 160 lb 6.4 oz (72.8 kg)  Height: 5\' 11"  (1.803 m)   Body mass index is 22.37 kg/m.  No flowsheet data found.  Current Medications (verified) Outpatient Encounter Medications as of 05/31/2017  Medication Sig  . Ascorbic Acid (VITAMIN C WITH ROSE HIPS) 500 MG tablet Take 500 mg by mouth daily.  . Calcium Carbonate-Vit D-Min (CALCIUM 1200 PO) Take by mouth.  . Digestive Enzymes (BETAINE HCL PO) Take by mouth.  . Hydrochloric Acid LIQD by Does not apply route.  Marland Kitchen HYDROcodone-acetaminophen (NORCO/VICODIN) 5-325 MG tablet Take 1 tablet by mouth every 6 (six) hours as needed for moderate pain.  . magnesium citrate SOLN Take 1 Bottle by mouth once.  . Multiple Vitamins-Minerals (MULTIVITAMIN WITH MINERALS) tablet Take 1 tablet by mouth daily.  . vitamin A 10000 UNIT capsule Take 10,000 Units by mouth daily.  Marland Kitchen zinc gluconate 50 MG tablet Take 30 mg by mouth daily.  . [DISCONTINUED] benzonatate (TESSALON) 100 MG capsule Take 1-2 capsules (100-200 mg total) by mouth 3 (three) times daily as needed for cough.  . [DISCONTINUED] Guaifenesin (MUCINEX MAXIMUM STRENGTH) 1200 MG TB12 Take 1 tablet (1,200 mg total) by mouth every 12 (twelve) hours as needed.  . [DISCONTINUED] Vitamin D, Ergocalciferol, (DRISDOL) 50000 units CAPS capsule Take 50,000 Units by mouth every 7 (seven) days.   No facility-administered encounter medications on file as of 05/31/2017.     Allergies (verified) Patient has no known allergies.   History: Past Medical History:  Diagnosis Date  . Blood transfusion without reported diagnosis    Past Surgical History:  Procedure Laterality Date  . SPINE SURGERY     Family History    Problem Relation Age of Onset  . Cancer Mother   . Heart disease Maternal Grandfather    Social History   Socioeconomic History  . Marital status: Married    Spouse name: Not on file  . Number of children: Not on file  . Years of education: Not on file  . Highest education level: Not on file  Occupational History  . Not on file  Social Needs  . Financial resource strain: Not on file  . Food insecurity:    Worry: Not on file    Inability: Not on file  . Transportation needs:    Medical: Not on file    Non-medical: Not on file  Tobacco Use  . Smoking status: Never Smoker  . Smokeless tobacco: Never Used  Substance and Sexual Activity  . Alcohol use: Yes    Comment: 2-3  . Drug use: No  . Sexual activity: Yes  Lifestyle  . Physical activity:    Days per week: Not on file    Minutes per session: Not on file  . Stress: Not on file  Relationships  . Social connections:    Talks on phone: Not on file    Gets together: Not on file    Attends religious service: Not on file    Active member of club or organization: Not on file    Attends meetings of clubs or organizations: Not on file  Relationship status: Not on file  Other Topics Concern  . Not on file  Social History Narrative  . Not on file   Tobacco Counseling Counseling given: Not Answered   Clinical Intake:                       Activities of Daily Living No flowsheet data found.   Immunizations and Health Maintenance Immunization History  Administered Date(s) Administered  . Influenza,inj,Quad PF,6+ Mos 10/01/2013, 10/21/2014  . Influenza-Unspecified 11/03/2016  . Pneumococcal Conjugate-13 10/01/2013  . Pneumococcal Polysaccharide-23 10/21/2014  . Tdap 10/01/2013   Health Maintenance Due  Topic Date Due  . COLONOSCOPY  08/27/1998    Patient Care Team: Horald Pollen, MD as PCP - General (Internal Medicine)  Indicate any recent Medical Services you may have received from  other than [facility] providers in the past year (date may be approximate).    Assessment:   This is a routine wellness examination for Jason Blevins.  Hearing/Vision screen  Visual Acuity Screening   Right eye Left eye Both eyes  Without correction: 20/25 20/40 20/20   With correction:       Dietary issues and exercise activities discussed:    Goals    None     Depression Screen PHQ 2/9 Scores 05/31/2017 05/31/2017 11/27/2016 08/06/2016  PHQ - 2 Score 0 0 0 0    Fall Risk Fall Risk  05/31/2017 05/31/2017 11/27/2016 08/06/2016 07/03/2016  Falls in the past year? No No No No No    Is the patient's home free of loose throw rugs in walkways, pet beds, electrical cords, etc?   {Blank single:19197::"yes","no"}      Grab bars in the bathroom? {Blank single:19197::"yes","no"}      Handrails on the stairs?   {Blank single:19197::"yes","no"}      Adequate lighting?   {Blank single:19197::"yes","no"}  Timed Get Up and Go performed: ***  Cognitive Function:        Screening Tests Health Maintenance  Topic Date Due  . COLONOSCOPY  08/27/1998  . INFLUENZA VACCINE  09/04/2017  . TETANUS/TDAP  10/02/2023  . Hepatitis C Screening  Completed  . PNA vac Low Risk Adult  Completed    Qualifies for Shingles Vaccine? ***  Cancer Screenings: Lung: Low Dose CT Chest recommended if Age 52-80 years, 30 pack-year currently smoking OR have quit w/in 15years. Patient {DOES NOT does:27190::"does not"} qualify. Colorectal: ***  Additional Screenings: *** Hepatitis C Screening:       Plan:   ***  I have personally reviewed and noted the following in the patient's chart:   . Medical and social history . Use of alcohol, tobacco or illicit drugs  . Current medications and supplements . Functional ability and status . Nutritional status . Physical activity . Advanced directives . List of other physicians . Hospitalizations, surgeries, and ER visits in previous 12 months . Vitals . Screenings to  include cognitive, depression, and falls . Referrals and appointments  In addition, I have reviewed and discussed with patient certain preventive protocols, quality metrics, and best practice recommendations. A written personalized care plan for preventive services as well as general preventive health recommendations were provided to patient.     [address] [place], [place]   05/31/2017

## 2017-05-31 NOTE — Patient Instructions (Addendum)
IF you received an x-ray today, you will receive an invoice from [facility]. Please contact [facility] at [phone] with questions or concerns regarding your invoice.   IF you received labwork today, you will receive an invoice from [facility]. Please contact [facility] at [phone] with questions or concerns regarding your invoice.   Our billing staff will not be able to assist you with questions regarding bills from these companies.  You will be contacted with the lab results as soon as they are available. The fastest way to get your results is to activate your My Chart account. Instructions are located on the last page of this paperwork. If you have not heard from [place] regarding the results in 2 weeks, please contact this office.      Colonoscopy, Adult A colonoscopy is an exam to look at the entire large intestine. During the exam, a lubricated, bendable tube is inserted into the anus and then passed into the rectum, colon, and other parts of the large intestine. A colonoscopy is often done as a part of normal colorectal screening or in response to certain symptoms, such as anemia, persistent diarrhea, abdominal pain, and blood in the stool. The exam can help screen for and diagnose medical problems, including:  Tumors.  Polyps.  Inflammation.  Areas of bleeding.  Tell a health care provider about:  Any allergies you have.  All medicines you are taking, including vitamins, herbs, eye drops, creams, and over-the-counter medicines.  Any problems you or family members have had with anesthetic medicines.  Any blood disorders you have.  Any surgeries you have had.  Any medical conditions you have.  Any problems you have had passing stool. What are the risks? Generally, this is a safe procedure. However, problems may occur, including:  Bleeding.  A tear in the intestine.  A reaction to medicines given during the exam.  Infection (rare).  What  happens before the procedure? Eating and drinking restrictions Follow instructions from your health care provider about eating and drinking, which may include:  A few days before the procedure - follow a low-fiber diet. Avoid nuts, seeds, dried fruit, raw fruits, and vegetables.  1-3 days before the procedure - follow a clear liquid diet. Drink only clear liquids, such as clear broth or bouillon, black coffee or tea, clear juice, clear soft drinks or sports drinks, gelatin dessert, and popsicles. Avoid any liquids that contain red or purple dye.  On the day of the procedure - do not eat or drink anything during the 2 hours before the procedure, or within the time period that your health care provider recommends.  Bowel prep If you were prescribed an oral bowel prep to clean out your colon:  Take it as told by your health care provider. Starting the day before your procedure, you will need to drink a large amount of medicated liquid. The liquid will cause you to have multiple loose stools until your stool is almost clear or light green.  If your skin or anus gets irritated from diarrhea, you may use these to relieve the irritation: ? Medicated wipes, such as adult wet wipes with aloe and vitamin E. ? A skin soothing-product like petroleum jelly.  If you vomit while drinking the bowel prep, take a break for up to 60 minutes and then begin the bowel prep again. If vomiting continues and you cannot take the bowel prep without vomiting, call your health care provider.  General instructions  Ask your health  care provider about changing or stopping your regular medicines. This is especially important if you are taking diabetes medicines or blood thinners.  Plan to have someone take you home from the hospital or clinic. What happens during the procedure?  An IV tube may be inserted into one of your veins.  You will be given medicine to help you relax (sedative).  To reduce your risk of  infection: ? Your health care team will wash or sanitize their hands. ? Your anal area will be washed with soap.  You will be asked to lie on your side with your knees bent.  Your health care provider will lubricate a long, thin, flexible tube. The tube will have a camera and a light on the end.  The tube will be inserted into your anus.  The tube will be gently eased through your rectum and colon.  Air will be delivered into your colon to keep it open. You may feel some pressure or cramping.  The camera will be used to take images during the procedure.  A small tissue sample may be removed from your body to be examined under a microscope (biopsy). If any potential problems are found, the tissue will be sent to a lab for testing.  If small polyps are found, your health care provider may remove them and have them checked for cancer cells.  The tube that was inserted into your anus will be slowly removed. The procedure may vary among health care providers and hospitals. What happens after the procedure?  Your blood pressure, heart rate, breathing rate, and blood oxygen level will be monitored until the medicines you were given have worn off.  Do not drive for 24 hours after the exam.  You may have a small amount of blood in your stool.  You may pass gas and have mild abdominal cramping or bloating due to the air that was used to inflate your colon during the exam.  It is up to you to get the results of your procedure. Ask your health care provider, or the department performing the procedure, when your results will be ready. This information is not intended to replace advice given to you by your health care provider. Make sure you discuss any questions you have with your health care provider. Document Released: 01/19/2000 Document Revised: 11/22/2015 Document Reviewed: 04/04/2015 Elsevier Interactive Patient Education  2018 [place] Maintenance, Male A healthy  lifestyle and preventive care is important for your health and wellness. Ask your health care provider about what schedule of regular examinations is right for you. What should I know about weight and diet? Eat a Healthy Diet  Eat plenty of vegetables, fruits, whole grains, low-fat dairy products, and lean protein.  Do not eat a lot of foods high in solid fats, added sugars, or salt.  Maintain a Healthy Weight Regular exercise can help you achieve or maintain a healthy weight. You should:  Do at least 150 minutes of exercise each week. The exercise should increase your heart rate and make you sweat (moderate-intensity exercise).  Do strength-training exercises at least twice a week.  Watch Your Levels of Cholesterol and Blood Lipids  Have your blood tested for lipids and cholesterol every 5 years starting at 69 years of age. If you are at high risk for heart disease, you should start having your blood tested when you are 69 years old. You may need to have your cholesterol levels checked more often if: ? Your  lipid or cholesterol levels are high. ? You are older than 69 years of age. ? You are at high risk for heart disease.  What should I know about cancer screening? Many types of cancers can be detected early and may often be prevented. Lung Cancer  You should be screened every year for lung cancer if: ? You are a current smoker who has smoked for at least 30 years. ? You are a former smoker who has quit within the past 15 years.  Talk to your health care provider about your screening options, when you should start screening, and how often you should be screened.  Colorectal Cancer  Routine colorectal cancer screening usually begins at 69 years of age and should be repeated every 5-10 years until you are 68 years old. You may need to be screened more often if early forms of precancerous polyps or small growths are found. Your health care provider may recommend screening at an  earlier age if you have risk factors for colon cancer.  Your health care provider may recommend using home test kits to check for hidden blood in the stool.  A small camera at the end of a tube can be used to examine your colon (sigmoidoscopy or colonoscopy). This checks for the earliest forms of colorectal cancer.  Prostate and Testicular Cancer  Depending on your age and overall health, your health care provider may do certain tests to screen for prostate and testicular cancer.  Talk to your health care provider about any symptoms or concerns you have about testicular or prostate cancer.  Skin Cancer  Check your skin from head to toe regularly.  Tell your health care provider about any new moles or changes in moles, especially if: ? There is a change in a mole's size, shape, or color. ? You have a mole that is larger than a pencil eraser.  Always use sunscreen. Apply sunscreen liberally and repeat throughout the day.  Protect yourself by wearing long sleeves, pants, a wide-brimmed hat, and sunglasses when outside.  What should I know about heart disease, diabetes, and high blood pressure?  If you are 4-60 years of age, have your blood pressure checked every 3-5 years. If you are 64 years of age or older, have your blood pressure checked every year. You should have your blood pressure measured twice-once when you are at a hospital or clinic, and once when you are not at a hospital or clinic. Record the average of the two measurements. To check your blood pressure when you are not at a hospital or clinic, you can use: ? An automated blood pressure machine at a pharmacy. ? A home blood pressure monitor.  Talk to your health care provider about your target blood pressure.  If you are between 49-20 years old, ask your health care provider if you should take aspirin to prevent heart disease.  Have regular diabetes screenings by checking your fasting blood sugar level. ? If you are at  a normal weight and have a low risk for diabetes, have this test once every three years after the age of 36. ? If you are overweight and have a high risk for diabetes, consider being tested at a younger age or more often.  A one-time screening for abdominal aortic aneurysm (AAA) by ultrasound is recommended for men aged 70-75 years who are current or former smokers. What should I know about preventing infection? Hepatitis B If you have a higher risk for hepatitis B, you  should be screened for this virus. Talk with your health care provider to find out if you are at risk for hepatitis B infection. Hepatitis C Blood testing is recommended for:  Everyone born from [birth year] through 1967.  Anyone with known risk factors for hepatitis C.  Sexually Transmitted Diseases (STDs)  You should be screened each year for STDs including gonorrhea and chlamydia if: ? You are sexually active and are younger than 69 years of age. ? You are older than 69 years of age and your health care provider tells you that you are at risk for this type of infection. ? Your sexual activity has changed since you were last screened and you are at an increased risk for chlamydia or gonorrhea. Ask your health care provider if you are at risk.  Talk with your health care provider about whether you are at high risk of being infected with HIV. Your health care provider may recommend a prescription medicine to help prevent HIV infection.  What else can I do?  Schedule regular health, dental, and eye exams.  Stay current with your vaccines (immunizations).  Do not use any tobacco products, such as cigarettes, chewing tobacco, and e-cigarettes. If you need help quitting, ask your health care provider.  Limit alcohol intake to no more than 2 drinks per day. One drink equals 12 ounces of beer, 5 ounces of wine, or 1 ounces of hard liquor.  Do not use street drugs.  Do not share needles.  Ask your health care provider for help  if you need support or information about quitting drugs.  Tell your health care provider if you often feel depressed.  Tell your health care provider if you have ever been abused or do not feel safe at home. This information is not intended to replace advice given to you by your health care provider. Make sure you discuss any questions you have with your health care provider. Document Released: 07/20/2007 Document Revised: 09/20/2015 Document Reviewed: 10/25/2014 Elsevier Interactive Patient Education  [employer].

## 2017-05-31 NOTE — Progress Notes (Signed)
Subjective:   Jason Blevins is a 67 y.o. male who presents for an Initial Medicare Annual Wellness Visit.  Review of Systems  Review of Systems  Constitutional: Negative.  Negative for chills, fever, malaise/fatigue and weight loss.  HENT: Negative.  Negative for congestion, ear pain, hearing loss, nosebleeds, sinus pain and sore throat.   Eyes: Negative.  Negative for blurred vision, double vision and pain.  Respiratory: Negative.  Negative for cough, hemoptysis and shortness of breath.   Cardiovascular: Negative.  Negative for chest pain, palpitations, claudication and leg swelling.  Gastrointestinal: Negative.  Negative for abdominal pain, blood in stool, diarrhea, nausea and vomiting.  Genitourinary: Negative.  Negative for dysuria and hematuria.  Musculoskeletal: Positive for back pain (Occasional).  Skin: Negative.  Negative for rash.  Neurological: Negative.  Negative for dizziness and headaches.  Endo/Heme/Allergies: Negative.   All other systems reviewed and are negative.        Objective:    Today's Vitals   05/31/17 1016  BP: 110/76  Pulse: (!) 56  Resp: 16  Temp: 98.4 F (36.9 C)  TempSrc: Oral  SpO2: 99%  Weight: 160 lb 6.4 oz (72.8 kg)  Height: 5\' 11"  (1.803 m)   Body mass index is 22.37 kg/m.  No flowsheet data found.  Current Medications (verified) Outpatient Encounter Medications as of 05/31/2017  Medication Sig  . Ascorbic Acid (VITAMIN C WITH ROSE HIPS) 500 MG tablet Take 500 mg by mouth daily.  . Calcium Carbonate-Vit D-Min (CALCIUM 1200 PO) Take by mouth.  . Digestive Enzymes (BETAINE HCL PO) Take by mouth.  . Hydrochloric Acid LIQD by Does not apply route.  Marland Kitchen HYDROcodone-acetaminophen (NORCO/VICODIN) 5-325 MG tablet Take 1 tablet by mouth every 6 (six) hours as needed for moderate pain.  . magnesium citrate SOLN Take 1 Bottle by mouth once.  . Multiple Vitamins-Minerals (MULTIVITAMIN WITH MINERALS) tablet Take 1 tablet by mouth daily.  .  vitamin A 10000 UNIT capsule Take 10,000 Units by mouth daily.  Marland Kitchen zinc gluconate 50 MG tablet Take 30 mg by mouth daily.  . [DISCONTINUED] HYDROcodone-acetaminophen (NORCO/VICODIN) 5-325 MG tablet Take 1 tablet by mouth every 6 (six) hours as needed for moderate pain.  Marland Kitchen Zoster Vaccine Adjuvanted [facility]) injection Inject 0.5 mLs into the muscle once for 1 dose.  . [DISCONTINUED] benzonatate (TESSALON) 100 MG capsule Take 1-2 capsules (100-200 mg total) by mouth 3 (three) times daily as needed for cough.  . [DISCONTINUED] Guaifenesin (MUCINEX MAXIMUM STRENGTH) 1200 MG TB12 Take 1 tablet (1,200 mg total) by mouth every 12 (twelve) hours as needed.  . [DISCONTINUED] Vitamin D, Ergocalciferol, (DRISDOL) 50000 units CAPS capsule Take 50,000 Units by mouth every 7 (seven) days.   No facility-administered encounter medications on file as of 05/31/2017.     Allergies (verified) Patient has no known allergies.   History: Past Medical History:  Diagnosis Date  . Blood transfusion without reported diagnosis    Past Surgical History:  Procedure Laterality Date  . SPINE SURGERY     Family History  Problem Relation Age of Onset  . Cancer Mother   . Heart disease Maternal Grandfather    Social History   Socioeconomic History  . Marital status: Married    Spouse name: Not on file  . Number of children: Not on file  . Years of education: Not on file  . Highest education level: Not on file  Occupational History  . Not on file  Social Needs  . Emergency planning/management officer  strain: Not on file  . Food insecurity:    Worry: Not on file    Inability: Not on file  . Transportation needs:    Medical: Not on file    Non-medical: Not on file  Tobacco Use  . Smoking status: Never Smoker  . Smokeless tobacco: Never Used  Substance and Sexual Activity  . Alcohol use: Yes    Comment: 2-3  . Drug use: No  . Sexual activity: Yes  Lifestyle  . Physical activity:    Days per week: Not on file     Minutes per session: Not on file  . Stress: Not on file  Relationships  . Social connections:    Talks on phone: Not on file    Gets together: Not on file    Attends religious service: Not on file    Active member of club or organization: Not on file    Attends meetings of clubs or organizations: Not on file    Relationship status: Not on file  Other Topics Concern  . Not on file  Social History Narrative  . Not on file   Tobacco Counseling Counseling given: Not Answered   Clinical Intake:     Physical Exam  Vitals reviewed. Constitutional: He is oriented to person, place, and time. He appears well-developed and well-nourished.  HENT:  Head: Normocephalic and atraumatic.  Right Ear: External ear normal.  Left Ear: External ear normal.  Nose: Nose normal.  Mouth/Throat: Oropharynx is clear and moist.  Eyes: Pupils are equal, round, and reactive to light. EOM are normal.  Neck: Normal range of motion. Neck supple. No JVD present. No thyromegaly present.  Cardiovascular: Normal rate, regular rhythm, normal heart sounds and intact distal pulses. Exam reveals no gallop.  No murmur heard. Respiratory: Effort normal and breath sounds normal. No respiratory distress. He has no wheezes. He has no rales.  GI: Soft. Bowel sounds are normal. He exhibits no distension and no mass. There is no tenderness.  Musculoskeletal: Normal range of motion. He exhibits no edema or tenderness.  Lymphadenopathy:    He has no cervical adenopathy.  Neurological: He is alert and oriented to person, place, and time. No cranial nerve deficit. He exhibits normal muscle tone. Coordination normal.  Skin: Skin is warm and dry. No rash noted.  Psychiatric: He has a normal mood and affect. His behavior is normal.                      Activities of Daily Living No flowsheet data found.   Immunizations and Health Maintenance Immunization History  Administered Date(s) Administered  .  Influenza,inj,Quad PF,6+ Mos 10/01/2013, 10/21/2014  . Influenza-Unspecified 11/03/2016  . Pneumococcal Conjugate-13 10/01/2013  . Pneumococcal Polysaccharide-23 10/21/2014  . Tdap 10/01/2013   Health Maintenance Due  Topic Date Due  . COLONOSCOPY  08/27/1998    Patient Care Team: Horald Pollen, MD as PCP - General (Internal Medicine)  Indicate any recent Medical Services you may have received from other than [facility] providers in the past year (date may be approximate).    Assessment:   This is a routine wellness examination for Rangel.  Hearing/Vision screen  Visual Acuity Screening   Right eye Left eye Both eyes  Without correction: 20/25 20/40 20/20   With correction:       Dietary issues and exercise activities discussed:    Goals    None     Depression Screen [facility] 2/9 Scores 05/31/2017 05/31/2017 11/27/2016  08/06/2016  PHQ - 2 Score 0 0 0 0    Fall Risk Fall Risk  05/31/2017 05/31/2017 11/27/2016 08/06/2016 07/03/2016  Falls in the past year? No No No No No       Cognitive Function: Excellent        Screening Tests Health Maintenance  Topic Date Due  . COLONOSCOPY  08/27/1998  . INFLUENZA VACCINE  09/04/2017  . TETANUS/TDAP  10/02/2023  . Hepatitis C Screening  Completed  . PNA vac Low Risk Adult  Completed    Qualifies for Shingles Vaccine? yes  Cancer Screenings: Lung: Low Dose CT Chest recommended if Age 9-80 years, 30 pack-year currently smoking OR have quit w/in 15years. Patient does not qualify. Colorectal: needs colonoscopy  Additional Screenings: no Hepatitis C Screening: Negative, 10/05/2013      Plan:     I have personally reviewed and noted the following in the patient's chart:   . Medical and social history . Use of alcohol, tobacco or illicit drugs  . Current medications and supplements . Functional ability and status . Nutritional status . Physical activity . Advanced directives . List of other  physicians . Hospitalizations, surgeries, and ER visits in previous 12 months . Vitals . Screenings to include cognitive, depression, and falls . Referrals and appointments  In addition, I have reviewed and discussed with patient certain preventive protocols, quality metrics, and best practice recommendations. A written personalized care plan for preventive services as well as general preventive health recommendations were provided to patient.     [address] [place], [place]   05/31/2017

## 2017-06-01 LAB — BMP8+EGFR
BUN/Creatinine Ratio: 23 (ref 10–24)
BUN: 24 mg/dL (ref 8–27)
CALCIUM: 9.9 mg/dL (ref 8.6–10.2)
CO2: 25 mmol/L (ref 20–29)
CREATININE: 1.04 mg/dL (ref 0.76–1.27)
Chloride: 101 mmol/L (ref 96–106)
GFR calc Af Amer: 85 mL/min/{1.73_m2} (ref >59)
GFR, EST NON AFRICAN AMERICAN: 73 mL/min/{1.73_m2} (ref >59)
GLUCOSE: 91 mg/dL (ref 65–99)
Potassium: 4.4 mmol/L (ref 3.5–5.2)
SODIUM: 140 mmol/L (ref 134–144)

## 2017-06-01 LAB — LIPID PANEL
CHOL/HDL RATIO: 2.2 ratio (ref 0.0–5.0)
Cholesterol, Total: 194 mg/dL (ref 100–199)
HDL: 88 mg/dL (ref >39)
LDL CALC: 93 mg/dL (ref 0–99)
TRIGLYCERIDES: 63 mg/dL (ref 0–149)
VLDL CHOLESTEROL CAL: 13 mg/dL (ref 5–40)

## 2017-06-01 LAB — HEPATIC FUNCTION PANEL
ALBUMIN: 4.4 g/dL (ref 3.6–4.8)
ALK PHOS: 98 IU/L (ref 39–117)
ALT: 33 IU/L (ref 0–44)
AST: 39 IU/L (ref 0–40)
Bilirubin Total: 0.5 mg/dL (ref 0.0–1.2)
Bilirubin, Direct: 0.16 mg/dL (ref 0.00–0.40)
TOTAL PROTEIN: 6.8 g/dL (ref 6.0–8.5)

## 2017-06-01 LAB — PSA: Prostate Specific Ag, Serum: 0.9 ng/mL (ref 0.0–4.0)

## 2017-06-03 ENCOUNTER — Ambulatory Visit: Payer: Medicare Other | Admitting: Emergency Medicine

## 2017-06-09 ENCOUNTER — Encounter: Payer: Self-pay | Admitting: Gastroenterology

## 2017-08-19 ENCOUNTER — Encounter: Payer: Self-pay | Admitting: Gastroenterology

## 2017-10-15 ENCOUNTER — Encounter: Payer: Self-pay | Admitting: Emergency Medicine

## 2017-10-15 ENCOUNTER — Other Ambulatory Visit: Payer: Self-pay

## 2017-10-15 ENCOUNTER — Ambulatory Visit (INDEPENDENT_AMBULATORY_CARE_PROVIDER_SITE_OTHER): Payer: Medicare Other | Admitting: Emergency Medicine

## 2017-10-15 VITALS — BP 129/73 | HR 63 | Temp 98.4°F | Resp 16 | Ht 71.5 in | Wt 156.6 lb

## 2017-10-15 DIAGNOSIS — R531 Weakness: Secondary | ICD-10-CM | POA: Diagnosis not present

## 2017-10-15 DIAGNOSIS — R5383 Other fatigue: Secondary | ICD-10-CM

## 2017-10-15 DIAGNOSIS — G8929 Other chronic pain: Secondary | ICD-10-CM | POA: Diagnosis not present

## 2017-10-15 DIAGNOSIS — Z23 Encounter for immunization: Secondary | ICD-10-CM

## 2017-10-15 DIAGNOSIS — M545 Low back pain: Secondary | ICD-10-CM | POA: Diagnosis not present

## 2017-10-15 MED ORDER — HYDROCODONE-ACETAMINOPHEN 5-325 MG PO TABS: 1.0000 | ORAL_TABLET | Freq: Four times a day (QID) | ORAL | 0 refills | Status: DC | PRN

## 2017-10-15 NOTE — Patient Instructions (Addendum)
     If you have lab work done today you will be contacted with your lab results within the next 2 weeks.  If you have not heard from [place] then please contact us. The fastest way to get your results is to register for My Chart.   IF you received an x-ray today, you will receive an invoice from [facility]. Please contact [facility] at [phone] with questions or concerns regarding your invoice.   IF you received labwork today, you will receive an invoice from [place]. Please contact [facility] at [phone] with questions or concerns regarding your invoice.   Our billing staff will not be able to assist you with questions regarding bills from these companies.  You will be contacted with the lab results as soon as they are available. The fastest way to get your results is to activate your My Chart account. Instructions are located on the last page of this paperwork. If you have not heard from [place] regarding the results in 2 weeks, please contact this office.     Weakness Weakness is a lack of strength. You may feel weak all over your body (generalized), or you may feel weak in one specific part of your body (focal). There are many potential causes of weakness. Sometimes, the cause of your weakness may not be known. Some causes of weakness can be serious, so it is important to see your doctor. Follow these instructions at home:  Rest as needed.  Try to get enough sleep. Talk to your doctor about how much sleep you need each night.  Take over-the-counter and prescription medicines only as told by your doctor.  Eat a healthy, well-balanced diet. This includes: ? Proteins to build muscles, such as lean meats and fish. ? Fresh fruits and vegetables. ? Carbohydrates to boost energy, such as whole grains.  Drink enough fluid to keep your pee (urine) clear or pale yellow.  Do strength exercises, such as arm curls and leg raises, for 30 minutes at least 2 days a week or as  told by your doctor.  Think about working with a physical therapist or trainer to help you get stronger.  Keep all follow-up visits as told by your doctor. This is important. Contact a doctor if:  Your weakness does not get better or it gets worse.  Your weakness affects your ability to: ? Think clearly. ? Do your normal daily activities. Get help right away if:  You have sudden weakness.  You have trouble breathing or shortness of breath.  You have problems with your vision.  You have trouble talking or swallowing.  You have trouble standing or walking.  You have chest pain.  You are light-headed.  You pass out (lose consciousness). This information is not intended to replace advice given to you by your health care provider. Make sure you discuss any questions you have with your health care provider. Document Released: 01/04/2008 Document Revised: 02/16/2015 Document Reviewed: 11/11/2014 Elsevier Interactive Patient Education  [employer].

## 2017-10-15 NOTE — Progress Notes (Signed)
Jesus Genera 67 y.o.   Chief Complaint  Patient presents with  . Medication Refill    hydrocodone Acetam    HISTORY OF PRESENT ILLNESS: This is a 67 y.o. male complaining of decreased stamina and general weakness for the past couple months.  No lifestyle changes.  No chronic medical problems.  Has occasional lumbar pain which is chronic but intermittent, mostly after exercising.  Denies rectal bleeding.  Denies hematuria.  No flulike symptoms.  No new medications.  Very concerned about world affairs.  Maybe increased emotional stress.  Denies any other associated significant symptoms.  HPI   Prior to Admission medications   Medication Sig Start Date End Date Taking? Authorizing Provider  Ascorbic Acid (VITAMIN C WITH ROSE HIPS) 500 MG tablet Take 500 mg by mouth daily.   Yes [provider]  Calcium Carbonate-Vit D-Min (CALCIUM 1200 PO) Take by mouth.   Yes [provider]  Digestive Enzymes (BETAINE HCL PO) Take by mouth.   Yes [provider]  Hydrochloric Acid LIQD by Does not apply route.   Yes [provider]  HYDROcodone-acetaminophen (NORCO/VICODIN) 5-325 MG tablet Take 1 tablet by mouth every 6 (six) hours as needed for moderate pain. 05/31/17  Yes Corderius Saraceni, Ines Bloomer, MD  IRON PO Take by mouth daily.   Yes [provider]  magnesium citrate SOLN Take 1 Bottle by mouth once.   Yes [provider]  Multiple Vitamins-Minerals (MULTIVITAMIN WITH MINERALS) tablet Take 1 tablet by mouth daily.   Yes [provider]  vitamin A 10000 UNIT capsule Take 10,000 Units by mouth daily.   Yes [provider]  zinc gluconate 50 MG tablet Take 30 mg by mouth daily.   Yes [provider]    No Known Allergies  Patient Active Problem List   Diagnosis Date Noted  . Bite, insect 08/06/2016  . Itching 08/06/2016  . Chronic right-sided low back pain without sciatica 07/03/2016    Past Medical History:    Diagnosis Date  . Blood transfusion without reported diagnosis     Past Surgical History:  Procedure Laterality Date  . SPINE SURGERY      Social History   Socioeconomic History  . Marital status: Married    Spouse name: Not on file  . Number of children: Not on file  . Years of education: Not on file  . Highest education level: Not on file  Occupational History  . Not on file  Social Needs  . Financial resource strain: Not on file  . Food insecurity:    Worry: Not on file    Inability: Not on file  . Transportation needs:    Medical: Not on file    Non-medical: Not on file  Tobacco Use  . Smoking status: Never Smoker  . Smokeless tobacco: Never Used  Substance and Sexual Activity  . Alcohol use: Yes    Comment: 2-3  . Drug use: No  . Sexual activity: Yes  Lifestyle  . Physical activity:    Days per week: Not on file    Minutes per session: Not on file  . Stress: Not on file  Relationships  . Social connections:    Talks on phone: Not on file    Gets together: Not on file    Attends religious service: Not on file    Active member of club or organization: Not on file    Attends meetings of clubs or organizations: Not on file  Relationship status: Not on file  . Intimate partner violence:    Fear of current or ex partner: Not on file    Emotionally abused: Not on file    Physically abused: Not on file    Forced sexual activity: Not on file  Other Topics Concern  . Not on file  Social History Narrative  . Not on file    Family History  Problem Relation Age of Onset  . Cancer Mother   . Heart disease Maternal Grandfather      Review of Systems  Constitutional: Positive for malaise/fatigue. Negative for chills and fever.  HENT: Negative.  Negative for nosebleeds and sore throat.   Eyes: Negative.  Negative for blurred vision and double vision.  Respiratory: Negative.  Negative for cough and shortness of breath.   Cardiovascular: Negative.   Negative for chest pain and palpitations.  Gastrointestinal: Negative.  Negative for abdominal pain, blood in stool, diarrhea, melena, nausea and vomiting.  Genitourinary: Negative.  Negative for dysuria and hematuria.  Musculoskeletal: Positive for back pain.  Skin: Negative.  Negative for rash.  Neurological: Negative.  Negative for dizziness and headaches.  Endo/Heme/Allergies: Negative.   All other systems reviewed and are negative.    Vitals:   10/15/17 1443  BP: 129/73  Pulse: 63  Resp: 16  Temp: 98.4 F (36.9 C)  SpO2: 95%     Physical Exam  Constitutional: He is oriented to person, place, and time. He appears well-developed and well-nourished.  HENT:  Head: Normocephalic and atraumatic.  Nose: Nose normal.  Mouth/Throat: Oropharynx is clear and moist.  Eyes: Pupils are equal, round, and reactive to light. Conjunctivae and EOM are normal.  Neck: Normal range of motion. Neck supple.  Cardiovascular: Normal rate, regular rhythm and normal heart sounds.  Pulmonary/Chest: Effort normal and breath sounds normal.  Abdominal: Soft. Bowel sounds are normal. He exhibits no distension. There is no tenderness.  Musculoskeletal: Normal range of motion. He exhibits no edema or tenderness.  Neurological: He is alert and oriented to person, place, and time. No sensory deficit. He exhibits normal muscle tone. Coordination normal.  Skin: Skin is warm and dry. Capillary refill takes less than 2 seconds. No rash noted.  Psychiatric: He has a normal mood and affect. His behavior is normal.  Vitals reviewed.  A total of 25 minutes was spent in the room with the patient, greater than 50% of which was in counseling/coordination of care regarding differential diagnosis, treatment, medications, and need for follow-up if no better or worse.   ASSESSMENT & PLAN: Ercole was seen today for medication refill.  Diagnoses and all orders for this visit:  General weakness -     TestT+TestF+SHBG -      CBC with Differential/Platelet -     Comprehensive metabolic panel -     TSH  Decreased stamina -     TestT+TestF+SHBG -     CBC with Differential/Platelet -     Comprehensive metabolic panel -     TSH  Chronic right-sided low back pain without sciatica -     HYDROcodone-acetaminophen (NORCO/VICODIN) 5-325 MG tablet; Take 1 tablet by mouth every 6 (six) hours as needed for moderate pain.  Influenza vaccine needed -     Cancel: Flu vaccine HIGH DOSE PF -     Flu Vaccine QUAD 36+ mos IM    Patient Instructions       If you have lab work done today you will be contacted with your  lab results within the next 2 weeks.  If you have not heard from [place] then please contact us. The fastest way to get your results is to register for My Chart.   IF you received an x-ray today, you will receive an invoice from [facility]. Please contact [facility] at [phone] with questions or concerns regarding your invoice.   IF you received labwork today, you will receive an invoice from [place]. Please contact [facility] at [phone] with questions or concerns regarding your invoice.   Our billing staff will not be able to assist you with questions regarding bills from these companies.  You will be contacted with the lab results as soon as they are available. The fastest way to get your results is to activate your My Chart account. Instructions are located on the last page of this paperwork. If you have not heard from [place] regarding the results in 2 weeks, please contact this office.     Weakness Weakness is a lack of strength. You may feel weak all over your body (generalized), or you may feel weak in one specific part of your body (focal). There are many potential causes of weakness. Sometimes, the cause of your weakness may not be known. Some causes of weakness can be serious, so it is important to see your doctor. Follow these instructions at home:  Rest as  needed.  Try to get enough sleep. Talk to your doctor about how much sleep you need each night.  Take over-the-counter and prescription medicines only as told by your doctor.  Eat a healthy, well-balanced diet. This includes: ? Proteins to build muscles, such as lean meats and fish. ? Fresh fruits and vegetables. ? Carbohydrates to boost energy, such as whole grains.  Drink enough fluid to keep your pee (urine) clear or pale yellow.  Do strength exercises, such as arm curls and leg raises, for 30 minutes at least 2 days a week or as told by your doctor.  Think about working with a physical therapist or trainer to help you get stronger.  Keep all follow-up visits as told by your doctor. This is important. Contact a doctor if:  Your weakness does not get better or it gets worse.  Your weakness affects your ability to: ? Think clearly. ? Do your normal daily activities. Get help right away if:  You have sudden weakness.  You have trouble breathing or shortness of breath.  You have problems with your vision.  You have trouble talking or swallowing.  You have trouble standing or walking.  You have chest pain.  You are light-headed.  You pass out (lose consciousness). This information is not intended to replace advice given to you by your health care provider. Make sure you discuss any questions you have with your health care provider. Document Released: 01/04/2008 Document Revised: 02/16/2015 Document Reviewed: 11/11/2014 Elsevier Interactive Patient Education  2018 Elsevier Inc.      Agustina Caroli, MD [facility]

## 2017-10-17 LAB — CBC WITH DIFFERENTIAL/PLATELET
BASOS: 1 %
Basophils Absolute: 0.1 10*3/uL (ref 0.0–0.2)
EOS (ABSOLUTE): 0.1 10*3/uL (ref 0.0–0.4)
EOS: 1 %
HEMATOCRIT: 45.6 % (ref 37.5–51.0)
Hemoglobin: 15.3 g/dL (ref 13.0–17.7)
IMMATURE GRANULOCYTES: 0 %
Immature Grans (Abs): 0 10*3/uL (ref 0.0–0.1)
LYMPHS: 20 %
Lymphocytes Absolute: 1.7 10*3/uL (ref 0.7–3.1)
MCH: 29.8 pg (ref 26.6–33.0)
MCHC: 33.6 g/dL (ref 31.5–35.7)
MCV: 89 fL (ref 79–97)
Monocytes Absolute: 0.4 10*3/uL (ref 0.1–0.9)
Monocytes: 5 %
NEUTROS PCT: 73 %
Neutrophils Absolute: 6 10*3/uL (ref 1.4–7.0)
Platelets: 206 10*3/uL (ref 150–450)
RBC: 5.14 x10E6/uL (ref 4.14–5.80)
RDW: 13.1 % (ref 12.3–15.4)
WBC: 8.3 10*3/uL (ref 3.4–10.8)

## 2017-10-17 LAB — COMPREHENSIVE METABOLIC PANEL
ALBUMIN: 4.5 g/dL (ref 3.6–4.8)
ALK PHOS: 101 IU/L (ref 39–117)
ALT: 31 IU/L (ref 0–44)
AST: 36 IU/L (ref 0–40)
Albumin/Globulin Ratio: 2 (ref 1.2–2.2)
BILIRUBIN TOTAL: 0.3 mg/dL (ref 0.0–1.2)
BUN / CREAT RATIO: 19 (ref 10–24)
BUN: 18 mg/dL (ref 8–27)
CHLORIDE: 98 mmol/L (ref 96–106)
CO2: 26 mmol/L (ref 20–29)
Calcium: 10.1 mg/dL (ref 8.6–10.2)
Creatinine, Ser: 0.95 mg/dL (ref 0.76–1.27)
GFR calc Af Amer: 94 mL/min/{1.73_m2} (ref >59)
GFR calc non Af Amer: 81 mL/min/{1.73_m2} (ref >59)
GLOBULIN, TOTAL: 2.3 g/dL (ref 1.5–4.5)
GLUCOSE: 89 mg/dL (ref 65–99)
Potassium: 4.5 mmol/L (ref 3.5–5.2)
SODIUM: 140 mmol/L (ref 134–144)
Total Protein: 6.8 g/dL (ref 6.0–8.5)

## 2017-10-17 LAB — TESTT+TESTF+SHBG
Sex Hormone Binding: 69.1 nmol/L (ref 19.3–76.4)
Testosterone, Free: 4.6 pg/mL — ABNORMAL LOW (ref 6.6–18.1)
Testosterone, total: 265.6 ng/dL (ref 264.0–916.0)

## 2017-10-17 LAB — TSH: TSH: 2.42 u[IU]/mL (ref 0.450–4.500)

## 2017-10-21 ENCOUNTER — Encounter: Payer: Self-pay | Admitting: *Deleted

## 2017-10-21 NOTE — Progress Notes (Signed)
Letter sent.

## 2017-10-31 ENCOUNTER — Telehealth: Payer: Self-pay | Admitting: Emergency Medicine

## 2017-10-31 DIAGNOSIS — R7989 Other specified abnormal findings of blood chemistry: Secondary | ICD-10-CM

## 2017-10-31 NOTE — Telephone Encounter (Signed)
I recommend that testosterone replacement therapy be discussed with endocrinologist.  Referral sent.

## 2017-10-31 NOTE — Telephone Encounter (Signed)
Copied from [place] [phone]. Topic: Quick Communication - See Telephone Encounter >> Oct 31, 2017  2:35 PM Mylinda Latina, NT wrote: CRM for notification. See Telephone encounter for: 10/31/17. Patient called and states she received his lab results by mail and he states based on his assessment for the lab results he should be put on testosterone. Patient is wanting to see if Dr .[name] is suggesting the same thing. Please call patient CB# [phone]

## 2017-11-20 DIAGNOSIS — R239 Unspecified skin changes: Secondary | ICD-10-CM | POA: Diagnosis not present

## 2017-11-20 DIAGNOSIS — Z23 Encounter for immunization: Secondary | ICD-10-CM | POA: Diagnosis not present

## 2017-11-20 DIAGNOSIS — D2272 Melanocytic nevi of left lower limb, including hip: Secondary | ICD-10-CM | POA: Diagnosis not present

## 2017-11-20 DIAGNOSIS — L821 Other seborrheic keratosis: Secondary | ICD-10-CM | POA: Diagnosis not present

## 2017-11-20 DIAGNOSIS — L570 Actinic keratosis: Secondary | ICD-10-CM | POA: Diagnosis not present

## 2017-11-20 DIAGNOSIS — D485 Neoplasm of uncertain behavior of skin: Secondary | ICD-10-CM | POA: Diagnosis not present

## 2017-11-20 DIAGNOSIS — Z85828 Personal history of other malignant neoplasm of skin: Secondary | ICD-10-CM | POA: Diagnosis not present

## 2017-11-24 DIAGNOSIS — Z6821 Body mass index (BMI) 21.0-21.9, adult: Secondary | ICD-10-CM | POA: Diagnosis not present

## 2017-11-24 DIAGNOSIS — E291 Testicular hypofunction: Secondary | ICD-10-CM | POA: Diagnosis not present

## 2017-12-09 DIAGNOSIS — H25813 Combined forms of age-related cataract, bilateral: Secondary | ICD-10-CM | POA: Diagnosis not present

## 2017-12-29 ENCOUNTER — Ambulatory Visit (INDEPENDENT_AMBULATORY_CARE_PROVIDER_SITE_OTHER): Payer: Medicare Other | Admitting: Family Medicine

## 2017-12-29 ENCOUNTER — Other Ambulatory Visit: Payer: Self-pay

## 2017-12-29 ENCOUNTER — Encounter: Payer: Self-pay | Admitting: Family Medicine

## 2017-12-29 VITALS — BP 131/77 | HR 57 | Temp 98.2°F | Resp 18 | Ht 71.5 in | Wt 158.0 lb

## 2017-12-29 DIAGNOSIS — J209 Acute bronchitis, unspecified: Secondary | ICD-10-CM

## 2017-12-29 DIAGNOSIS — R059 Cough, unspecified: Secondary | ICD-10-CM

## 2017-12-29 DIAGNOSIS — R195 Other fecal abnormalities: Secondary | ICD-10-CM

## 2017-12-29 DIAGNOSIS — R5383 Other fatigue: Secondary | ICD-10-CM | POA: Diagnosis not present

## 2017-12-29 DIAGNOSIS — R05 Cough: Secondary | ICD-10-CM | POA: Diagnosis not present

## 2017-12-29 LAB — POCT CBC
Granulocyte percent: 75.8 %G (ref 37–80)
HEMATOCRIT: 45.9 % — AB (ref 29–41)
HEMOGLOBIN: 15.5 g/dL — AB (ref 9.5–13.5)
LYMPH, POC: 2.3 (ref 0.6–3.4)
MCH, POC: 30.1 pg (ref 27–31.2)
MCHC: 33.8 g/dL (ref 31.8–35.4)
MCV: 89.1 fL (ref 76–111)
MID (cbc): 0.3 (ref 0–0.9)
MPV: 9.9 fL (ref 0–99.8)
POC Granulocyte: 8.1 — AB (ref 2–6.9)
POC LYMPH PERCENT: 21.1 %L (ref 10–50)
POC MID %: 3.1 %M (ref 0–12)
Platelet Count, POC: 335 10*3/uL (ref 142–424)
RBC: 5.15 M/uL (ref 4.69–6.13)
RDW, POC: 13.2 %
WBC: 10.7 10*3/uL — AB (ref 4.6–10.2)

## 2017-12-29 MED ORDER — BENZONATATE 100 MG PO CAPS: 100.0000 mg | ORAL_CAPSULE | Freq: Three times a day (TID) | ORAL | 0 refills | Status: DC | PRN

## 2017-12-29 MED ORDER — AMOXICILLIN-POT CLAVULANATE 875-125 MG PO TABS: 1.0000 | ORAL_TABLET | Freq: Two times a day (BID) | ORAL | 0 refills | Status: DC

## 2017-12-29 NOTE — Progress Notes (Signed)
Patient ID: Jason Blevins, male    DOB: 01-Feb-1951  Age: 67 y.o. MRN: [id number]  Chief Complaint  Patient presents with  . 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  Now he has had a bad cough for the last 3 days.  He is coughing up some brown mucus.  He does not smoke.  He did have a flu shot this year.  He feels like he has no energy of late.  He does note that his stools have been darker, so he cut back on the iron he was taking.  Has not noticed any change since.  He takes various supplements but is not on any prescription medications.  Current allergies, medications, problem list, past/family and social histories reviewed.  Objective:  BP 131/77   Pulse (!) 57   Temp 98.2 F (36.8 C) (Oral)   Resp 18   Ht 5' 11.5" (1.816 m)   Wt 158 lb (71.7 kg)   SpO2 98%   BMI 21.73 kg/m   No major acute distress.  TMs are normal.  Throat not erythematous.  He has a little whitish scar on the left side where his tonsils were removed.  Is sinuses are nontender.  Neck supple without significant nodes.  Chest clear to auscultation.  Forced expiration is adequate with no wheezing.  Heart regular without murmur.  Abdomen soft and nontender.  No CVA tenderness.  Assessment & Plan:   Assessment: 1. Cough   2. Fatigue, unspecified type   3. Dark stools       Plan: We will check a CBC and decide accordingly.  Probably just need to treat him for this cough and mild bronchitis.  Orders Placed This Encounter  Procedures  . POCT CBC    No orders of the defined types were placed in this encounter.  Results for orders placed or performed in visit on 12/29/17  POCT CBC  Result Value Ref Range   WBC 10.7 (A) 4.6 - 10.2 K/uL   Lymph, poc 2.3 0.6 - 3.4   POC LYMPH PERCENT 21.1 10 - 50 %L   MID (cbc) 0.3 0 - 0.9   POC MID % 3.1 0 - 12 %M   POC Granulocyte 8.1 (A) 2 - 6.9   Granulocyte percent  75.8 37 - 80 %G   RBC 5.15 4.69 - 6.13 M/uL   Hemoglobin 15.5 (A) 9.5 - 13.5 g/dL   HCT, POC 45.9 (A) 29 - 41 %   MCV 89.1 76 - 111 fL   MCH, POC 30.1 27 - 31.2 pg   MCHC 33.8 31.8 - 35.4 g/dL   RDW, POC 13.2 %   Platelet Count, POC 335 142 - 424 K/uL   MPV 9.9 0 - 99.8 fL    CBC is consistent with having a secondary bacterial infection causing the white count to go up.  Reassurance about the hemoglobin.    Patient Instructions    Drink plenty of fluids and get enough rest  Take Augmentin (amoxicillin/clavulanate) 875 mg 1 twice daily for infection  Benzonatate cough pills 1 or 2 pills 3 times daily as needed for daytime cough.  This is fairly nonsedating so you can take it and function in the daytime.  In addition to the benzonatate you can take a DM-containing cough syrup.  The Mucinex DM also thins the secretions so you can cough  them out better.  Other options are Delsym or Robitussin DM or generic equivalents.  Take Tylenol or ibuprofen if needed for aching.  If your fatigue continues to persist you may wish to get a physical exam sometime just to see if anything else is going on with you.  Return as needed   If you have lab work done today you will be contacted with your lab results within the next 2 weeks.  If you have not heard from [place] then please contact us. The fastest way to get your results is to register for My Chart.   IF you received an x-ray today, you will receive an invoice from [facility]. Please contact [facility] at [phone] with questions or concerns regarding your invoice.   IF you received labwork today, you will receive an invoice from [place]. Please contact [facility] at [phone] with questions or concerns regarding your invoice.   Our billing staff will not be able to assist you with questions regarding bills from these companies.  You will be contacted with the lab results as soon as they are available. The fastest way  to get your results is to activate your My Chart account. Instructions are located on the last page of this paperwork. If you have not heard from [place] regarding the results in 2 weeks, please contact this office.        Return if symptoms worsen or fail to improve.   [name], MD 12/29/2017

## 2017-12-29 NOTE — Patient Instructions (Addendum)
  Drink plenty of fluids and get enough rest  Take Augmentin (amoxicillin/clavulanate) 875 mg 1 twice daily for infection  Benzonatate cough pills 1 or 2 pills 3 times daily as needed for daytime cough.  This is fairly nonsedating so you can take it and function in the daytime.  In addition to the benzonatate you can take a DM-containing cough syrup.  The Mucinex DM also thins the secretions so you can cough them out better.  Other options are Delsym or Robitussin DM or generic equivalents.  Take Tylenol or ibuprofen if needed for aching.  If your fatigue continues to persist you may wish to get a physical exam sometime just to see if anything else is going on with you.  Return as needed   If you have lab work done today you will be contacted with your lab results within the next 2 weeks.  If you have not heard from [place] then please contact us. The fastest way to get your results is to register for My Chart.   IF you received an x-ray today, you will receive an invoice from [facility]. Please contact [facility] at [phone] with questions or concerns regarding your invoice.   IF you received labwork today, you will receive an invoice from [place]. Please contact [facility] at [phone] with questions or concerns regarding your invoice.   Our billing staff will not be able to assist you with questions regarding bills from these companies.  You will be contacted with the lab results as soon as they are available. The fastest way to get your results is to activate your My Chart account. Instructions are located on the last page of this paperwork. If you have not heard from [place] regarding the results in 2 weeks, please contact this office.

## 2018-01-09 DIAGNOSIS — D485 Neoplasm of uncertain behavior of skin: Secondary | ICD-10-CM | POA: Diagnosis not present

## 2018-02-18 DIAGNOSIS — E291 Testicular hypofunction: Secondary | ICD-10-CM | POA: Diagnosis not present

## 2018-04-06 ENCOUNTER — Ambulatory Visit: Payer: Self-pay | Admitting: *Deleted

## 2018-04-06 NOTE — Telephone Encounter (Signed)
Call transferred by agent stating the the pt had complaints of chest tightness and and being diagnosed with bronchitis months ago and fully getting over it. LOV on 12/29/17.Attempted to triage the pt but was unable to complete at this time. When attempting to ask questions about current symptoms, Pt states I just told someone and then states "never mind I am going to have a different provider" and ended the call.

## 2018-04-07 ENCOUNTER — Other Ambulatory Visit: Payer: Self-pay

## 2018-04-07 ENCOUNTER — Encounter: Payer: Self-pay | Admitting: Emergency Medicine

## 2018-04-07 ENCOUNTER — Ambulatory Visit (INDEPENDENT_AMBULATORY_CARE_PROVIDER_SITE_OTHER): Payer: PPO

## 2018-04-07 ENCOUNTER — Ambulatory Visit (INDEPENDENT_AMBULATORY_CARE_PROVIDER_SITE_OTHER): Payer: PPO | Admitting: Emergency Medicine

## 2018-04-07 VITALS — BP 115/75 | HR 67 | Temp 98.7°F | Resp 16 | Ht 71.0 in | Wt 151.4 lb

## 2018-04-07 DIAGNOSIS — R053 Chronic cough: Secondary | ICD-10-CM

## 2018-04-07 DIAGNOSIS — R0609 Other forms of dyspnea: Secondary | ICD-10-CM | POA: Insufficient documentation

## 2018-04-07 DIAGNOSIS — R059 Cough, unspecified: Secondary | ICD-10-CM

## 2018-04-07 DIAGNOSIS — R05 Cough: Secondary | ICD-10-CM | POA: Diagnosis not present

## 2018-04-07 MED ORDER — DOXYCYCLINE HYCLATE 100 MG PO TABS: 100.0000 mg | ORAL_TABLET | Freq: Two times a day (BID) | ORAL | 0 refills | Status: AC

## 2018-04-07 NOTE — Patient Instructions (Addendum)
     If you have lab work done today you will be contacted with your lab results within the next 2 weeks.  If you have not heard from us then please contact us. The fastest way to get your results is to register for My Chart.   IF you received an x-ray today, you will receive an invoice from [place] [facility]. Please contact [facility] at [phone] with questions or concerns regarding your invoice.   IF you received labwork today, you will receive an invoice from [facility]. Please contact [facility] at [phone] with questions or concerns regarding your invoice.   Our billing staff will not be able to assist you with questions regarding bills from these companies.  You will be contacted with the lab results as soon as they are available. The fastest way to get your results is to activate your My Chart account. Instructions are located on the last page of this paperwork. If you have not heard from us regarding the results in 2 weeks, please contact this office.     Cough, Adult  A cough helps to clear your throat and lungs. A cough may last only 2-3 weeks (acute), or it may last longer than 8 weeks (chronic). Many different things can cause a cough. A cough may be a sign of an illness or another medical condition. Follow these instructions at home:  Pay attention to any changes in your cough.  Take medicines only as told by your doctor. ? If you were prescribed an antibiotic medicine, take it as told by your doctor. Do not stop taking it even if you start to feel better. ? Talk with your doctor before you try using a cough medicine.  Drink enough fluid to keep your pee (urine) clear or pale yellow.  If the air is dry, use a cold steam vaporizer or humidifier in your home.  Stay away from things that make you cough at work or at home.  If your cough is worse at night, try using extra pillows to raise your head up higher while you sleep.  Do not smoke, and try not to  be around smoke. If you need help quitting, ask your doctor.  Do not have caffeine.  Do not drink alcohol.  Rest as needed. Contact a doctor if:  You have new problems (symptoms).  You cough up yellow fluid (pus).  Your cough does not get better after 2-3 weeks, or your cough gets worse.  Medicine does not help your cough and you are not sleeping well.  You have pain that gets worse or pain that is not helped with medicine.  You have a fever.  You are losing weight and you do not know why.  You have night sweats. Get help right away if:  You cough up blood.  You have trouble breathing.  Your heartbeat is very fast. This information is not intended to replace advice given to you by your health care provider. Make sure you discuss any questions you have with your health care provider. Document Released: 10/04/2010 Document Revised: 06/29/2015 Document Reviewed: 03/30/2014 Elsevier Interactive Patient Education  2019 Elsevier Inc.  

## 2018-04-07 NOTE — Progress Notes (Signed)
Jason Blevins 68 y.o.   Chief Complaint  Patient presents with  . Cough    nonproductive with tightness in the chest last office visit 12/29/2017    HISTORY OF PRESENT ILLNESS: This is a 68 y.o. male complaining of nonproductive cough for the past week.  Was seen here on 12/29/2017 for the same by Dr. Linna Darner, started on Augmentin and Tessalon, patient does think he never got better. Has chronic general weakness.  Work-up last year showed low testosterone level.  Was referred and seen by an endocrinologist.  Patient declined testosterone supplementation. Also has a history of depression but does not not want to be on medication.  Reluctant to see psychiatrist.   HPI   Prior to Admission medications   Medication Sig Start Date End Date Taking? Authorizing Provider  Ascorbic Acid (VITAMIN C WITH ROSE HIPS) 500 MG tablet Take 500 mg by mouth daily.   Yes [provider]  Calcium Carbonate-Vit D-Min (CALCIUM 1200 PO) Take by mouth.   Yes [provider]  Digestive Enzymes (BETAINE HCL PO) Take by mouth.   Yes [provider]  Hydrochloric Acid LIQD by Does not apply route.   Yes [provider]  HYDROcodone-acetaminophen (NORCO/VICODIN) 5-325 MG tablet Take 1 tablet by mouth every 6 (six) hours as needed for moderate pain. 10/15/17  Yes Rolla Servidio, Ines Bloomer, MD  IRON PO Take by mouth daily.   Yes [provider]  magnesium citrate SOLN Take 1 Bottle by mouth once.   Yes [provider]  Multiple Vitamins-Minerals (MULTIVITAMIN WITH MINERALS) tablet Take 1 tablet by mouth daily.   Yes [provider]  vitamin A 10000 UNIT capsule Take 10,000 Units by mouth daily.   Yes [provider]  zinc gluconate 50 MG tablet Take 30 mg by mouth daily.   Yes [provider]    No Known Allergies  Patient Active Problem List   Diagnosis Date Noted  . General weakness 10/15/2017  . Decreased stamina 10/15/2017  . Bite,  insect 08/06/2016  . Itching 08/06/2016  . Chronic right-sided low back pain without sciatica 07/03/2016    Past Medical History:  Diagnosis Date  . Blood transfusion without reported diagnosis     Past Surgical History:  Procedure Laterality Date  . SPINE SURGERY      Social History   Socioeconomic History  . Marital status: Married    Spouse name: Not on file  . Number of children: Not on file  . Years of education: Not on file  . Highest education level: Not on file  Occupational History  . Not on file  Social Needs  . Financial resource strain: Not on file  . Food insecurity:    Worry: Not on file    Inability: Not on file  . Transportation needs:    Medical: Not on file    Non-medical: Not on file  Tobacco Use  . Smoking status: Never Smoker  . Smokeless tobacco: Never Used  Substance and Sexual Activity  . Alcohol use: Yes    Comment: 2-3  . Drug use: No  . Sexual activity: Yes  Lifestyle  . Physical activity:    Days per week: Not on file    Minutes per session: Not on file  . Stress: Not on file  Relationships  . Social connections:    Talks on phone: Not on file    Gets together: Not on file    Attends religious service: Not on  file    Active member of club or organization: Not on file    Attends meetings of clubs or organizations: Not on file    Relationship status: Not on file  . Intimate partner violence:    Fear of current or ex partner: Not on file    Emotionally abused: Not on file    Physically abused: Not on file    Forced sexual activity: Not on file  Other Topics Concern  . Not on file  Social History Narrative  . Not on file    Family History  Problem Relation Age of Onset  . Cancer Mother   . Heart disease Maternal Grandfather      Review of Systems  Constitutional: Positive for malaise/fatigue. Negative for chills and fever.  HENT: Negative.   Eyes: Negative.   Respiratory: Positive for cough.        Dyspnea on  exertion  Cardiovascular: Positive for palpitations (With exertion). Negative for chest pain.  Gastrointestinal: Negative.  Negative for abdominal pain, diarrhea, nausea and vomiting.  Genitourinary: Negative.   Musculoskeletal: Negative.   Skin: Negative.  Negative for rash.  Neurological: Negative.  Negative for dizziness and headaches.  Endo/Heme/Allergies: Negative.   All other systems reviewed and are negative.  Vitals:   04/07/18 1132  BP: 115/75  Pulse: 67  Resp: 16  Temp: 98.7 F (37.1 C)  SpO2: 98%     Physical Exam Vitals signs reviewed.  Constitutional:      Appearance: Normal appearance.  HENT:     Head: Normocephalic and atraumatic.     Mouth/Throat:     Mouth: Mucous membranes are moist.     Pharynx: Oropharynx is clear.  Eyes:     Extraocular Movements: Extraocular movements intact.     Conjunctiva/sclera: Conjunctivae normal.     Pupils: Pupils are equal, round, and reactive to light.  Neck:     Musculoskeletal: Normal range of motion and neck supple.  Cardiovascular:     Rate and Rhythm: Normal rate and regular rhythm.     Heart sounds: Normal heart sounds.  Pulmonary:     Effort: Pulmonary effort is normal.     Breath sounds: Normal breath sounds.  Musculoskeletal: Normal range of motion.  Skin:    General: Skin is warm and dry.     Capillary Refill: Capillary refill takes less than 2 seconds.  Neurological:     General: No focal deficit present.     Mental Status: He is alert and oriented to person, place, and time.  Psychiatric:     Comments: Seems angry and upset    Dg Chest 2 View  Result Date: 04/07/2018 CLINICAL DATA:  Cough. EXAM: CHEST - 2 VIEW COMPARISON:  None. FINDINGS: The heart size and mediastinal contours are within normal limits. Normal pulmonary vascularity. No focal consolidation, pleural effusion, or pneumothorax. No acute osseous abnormality. IMPRESSION: No active cardiopulmonary disease. Electronically Signed   By: Titus Dubin M.D.   On: 04/07/2018 12:15     ASSESSMENT & PLAN: Ardon was seen today for cough.  Diagnoses and all orders for this visit:  Cough -     DG Chest 2 View; Future -     doxycycline (VIBRA-TABS) 100 MG tablet; Take 1 tablet (100 mg total) by mouth 2 (two) times daily for 7 days.  Chronic cough -     Ambulatory referral to Pulmonology  Dyspnea on exertion -     Ambulatory referral to Cardiology  Patient Instructions       If you have lab work done today you will be contacted with your lab results within the next 2 weeks.  If you have not heard from [place] then please contact us. The fastest way to get your results is to register for My Chart.   IF you received an x-ray today, you will receive an invoice from [facility]. Please contact [facility] at [phone] with questions or concerns regarding your invoice.   IF you received labwork today, you will receive an invoice from [place]. Please contact [facility] at [phone] with questions or concerns regarding your invoice.   Our billing staff will not be able to assist you with questions regarding bills from these companies.  You will be contacted with the lab results as soon as they are available. The fastest way to get your results is to activate your My Chart account. Instructions are located on the last page of this paperwork. If you have not heard from [place] regarding the results in 2 weeks, please contact this office.     Cough, Adult  A cough helps to clear your throat and lungs. A cough may last only 2-3 weeks (acute), or it may last longer than 8 weeks (chronic). Many different things can cause a cough. A cough may be a sign of an illness or another medical condition. Follow these instructions at home:  Pay attention to any changes in your cough.  Take medicines only as told by your doctor. ? If you were prescribed an antibiotic medicine, take it as told by your doctor. Do not stop taking it  even if you start to feel better. ? Talk with your doctor before you try using a cough medicine.  Drink enough fluid to keep your pee (urine) clear or pale yellow.  If the air is dry, use a cold steam vaporizer or humidifier in your home.  Stay away from things that make you cough at work or at home.  If your cough is worse at night, try using extra pillows to raise your head up higher while you sleep.  Do not smoke, and try not to be around smoke. If you need help quitting, ask your doctor.  Do not have caffeine.  Do not drink alcohol.  Rest as needed. Contact a doctor if:  You have new problems (symptoms).  You cough up yellow fluid (pus).  Your cough does not get better after 2-3 weeks, or your cough gets worse.  Medicine does not help your cough and you are not sleeping well.  You have pain that gets worse or pain that is not helped with medicine.  You have a fever.  You are losing weight and you do not know why.  You have night sweats. Get help right away if:  You cough up blood.  You have trouble breathing.  Your heartbeat is very fast. This information is not intended to replace advice given to you by your health care provider. Make sure you discuss any questions you have with your health care provider. Document Released: 10/04/2010 Document Revised: 06/29/2015 Document Reviewed: 03/30/2014 Elsevier Interactive Patient Education  2019 Elsevier Inc.      Agustina Caroli, MD [facility]

## 2018-04-07 NOTE — Telephone Encounter (Signed)
Noted  

## 2018-06-22 ENCOUNTER — Ambulatory Visit (INDEPENDENT_AMBULATORY_CARE_PROVIDER_SITE_OTHER): Payer: PPO

## 2018-06-22 ENCOUNTER — Other Ambulatory Visit: Payer: Self-pay

## 2018-06-22 ENCOUNTER — Ambulatory Visit (INDEPENDENT_AMBULATORY_CARE_PROVIDER_SITE_OTHER): Payer: PPO | Admitting: Internal Medicine

## 2018-06-22 ENCOUNTER — Encounter: Payer: Self-pay | Admitting: Internal Medicine

## 2018-06-22 VITALS — BP 108/66 | HR 54 | Ht 71.0 in | Wt 149.0 lb

## 2018-06-22 DIAGNOSIS — R0609 Other forms of dyspnea: Secondary | ICD-10-CM

## 2018-06-22 LAB — BASIC METABOLIC PANEL
BUN: 22 mg/dL (ref 6–23)
CO2: 32 mEq/L (ref 19–32)
Calcium: 9.8 mg/dL (ref 8.4–10.5)
Chloride: 102 mEq/L (ref 96–112)
Creatinine, Ser: 0.97 mg/dL (ref 0.40–1.50)
GFR: 76.56 mL/min (ref >60.00)
Glucose, Bld: 86 mg/dL (ref 70–99)
Potassium: 4.3 mEq/L (ref 3.5–5.1)
Sodium: 139 mEq/L (ref 135–145)

## 2018-06-22 LAB — CBC WITH DIFFERENTIAL/PLATELET
Basophils Absolute: 0 10*3/uL (ref 0.0–0.1)
Basophils Relative: 0.5 % (ref 0.0–3.0)
Eosinophils Absolute: 0.1 10*3/uL (ref 0.0–0.7)
Eosinophils Relative: 1.3 % (ref 0.0–5.0)
HCT: 42.7 % (ref 39.0–52.0)
Hemoglobin: 14.6 g/dL (ref 13.0–17.0)
Lymphocytes Relative: 30.3 % (ref 12.0–46.0)
Lymphs Abs: 2.3 10*3/uL (ref 0.7–4.0)
MCHC: 34.2 g/dL (ref 30.0–36.0)
MCV: 88.2 fl (ref 78.0–100.0)
Monocytes Absolute: 0.5 10*3/uL (ref 0.1–1.0)
Monocytes Relative: 6.3 % (ref 3.0–12.0)
Neutro Abs: 4.7 10*3/uL (ref 1.4–7.7)
Neutrophils Relative %: 61.6 % (ref 43.0–77.0)
Platelets: 190 10*3/uL (ref 150.0–400.0)
RBC: 4.84 Mil/uL (ref 4.22–5.81)
RDW: 13.3 % (ref 11.5–15.5)
WBC: 7.5 10*3/uL (ref 4.0–10.5)

## 2018-06-22 LAB — SEDIMENTATION RATE: Sed Rate: 2 mm/hr (ref 0–20)

## 2018-06-22 LAB — BRAIN NATRIURETIC PEPTIDE: Pro B Natriuretic peptide (BNP): 24 pg/mL (ref 0.0–100.0)

## 2018-06-22 LAB — TSH: TSH: 2.34 u[IU]/mL (ref 0.35–4.50)

## 2018-06-22 NOTE — Progress Notes (Signed)
Spoke with pt and notified of results per Dr. Wert. Pt verbalized understanding and denied any questions. 

## 2018-06-22 NOTE — Progress Notes (Signed)
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 Benefit around 1962 grew out of it in 1968 age 68  And no problems and no need for inhalers but since then colds tend  to go into chest and need abx from twice a year down once every 2 years and "fine" in between until late 2019 indolent onset worsening sob then acute   typical uri much worse than usual  with chest tightness eval by Jason Blevins 12/29/17 rx  augmentin which resolved URI acute symptoms but never back to baseline ex tol   referred to pulmonary clinic 06/22/2018 by Dr Jason Blevins     History of Present Illness  06/22/2018  Pulmonary/ 1st office eval/Jason Blevins  Chief Complaint  Patient presents with  . Pulmonary Consult    Pt c/o DOE x 6 months. He had to stop playing basketball and had to stop due to DOE and he also gets SOB running with his dog.   Dyspnea:  MMRC1 = can walk nl pace, flat grade, can't hurry or go uphills or steps s sob Cough: gone now completely  Sleep: lie flat ok SABA use: no  No obvious day to day or daytime variability or assoc excess/ purulent sputum or mucus plugs or hemoptysis or cp or chest tightness, subjective wheeze or overt sinus or hb symptoms.   Sleeping flat ok  without nocturnal  or early am exacerbation  of respiratory  c/o's or need for noct saba. Also denies any obvious fluctuation of symptoms with weather or environmental changes or other aggravating or alleviating factors except as outlined above   No unusual exposure hx or h/o childhood pna or knowledge of premature birth.  Current Allergies, Complete Past Medical History, Past Surgical History, Family History, and Social History were reviewed in [employer] record.  ROS  The following are not active complaints unless bolded Hoarseness, sore  throat, dysphagia, dental problems, itching, sneezing,  nasal congestion or discharge of excess mucus or purulent secretions, ear ache,   fever, chills, sweats, unintended wt loss or wt gain, classically pleuritic or exertional cp,  orthopnea pnd or arm/hand swelling  or leg swelling, presyncope, palpitations, abdominal pain, anorexia, nausea, vomiting, diarrhea  or change in bowel habits or change in bladder habits, change in stools or change in urine, dysuria, hematuria,  rash, arthralgias, visual complaints, headache, numbness, weakness or ataxia or problems with walking or coordination,  change in mood or  memory.               Past Medical History:  Diagnosis Date  . Blood transfusion without reported diagnosis     Outpatient Medications Prior to Visit  Medication Sig Dispense Refill  . Ascorbic Acid (VITAMIN C WITH ROSE HIPS) 500 MG tablet Take 500 mg by mouth daily.    . Calcium Carbonate-Vit D-Min (CALCIUM 1200 PO) Take by mouth.    Marland Kitchen DHEA 10 MG CAPS Take 2 capsules by mouth daily.    Marland Kitchen HYDROcodone-acetaminophen (NORCO/VICODIN) 5-325 MG tablet Take 1 tablet by mouth every 6 (six) hours as needed for moderate pain. 20 tablet 0  . IRON PO Take by mouth daily.    . magnesium citrate SOLN Take 1 Bottle by mouth once.    . Multiple Vitamins-Minerals (MULTIVITAMIN WITH MINERALS)  tablet Take 1 tablet by mouth daily.    Marland Kitchen UNABLE TO FIND Med Name: Precious Gilding  OTC daily    . UNABLE TO FIND Med Name: russion choice immune    . vitamin A 10000 UNIT capsule Take 10,000 Units by mouth daily.    Marland Kitchen zinc gluconate 50 MG tablet Take 30 mg by mouth daily.    . Digestive Enzymes (BETAINE HCL PO) Take by mouth.    . Hydrochloric Acid LIQD by Does not apply route.        Objective:     BP 108/66 (BP Location: Left Arm, Cuff Size: Normal)   Pulse (!) 54   Ht 5\' 11"  (1.803 m)   Wt 149 lb (67.6 kg)   SpO2 98%   BMI 20.78 kg/m   SpO2: 98 %  RA  Pleasant somber wm nad  HEENT: nl dentition,  turbinates bilaterally, and oropharynx. Nl external ear canals without cough reflex   NECK :  without JVD/Nodes/TM/ nl carotid upstrokes bilaterally   LUNGS: no acc muscle use,  Nl contour chest which is clear to A and P bilaterally without cough on insp or exp maneuvers   CV:  RRR  no s3 or murmur or increase in P2, and no edema   ABD:  soft and nontender with nl inspiratory excursion in the supine position. No bruits or organomegaly appreciated, bowel sounds nl  MS:  Nl gait/ ext warm without deformities, calf tenderness, cyanosis or clubbing No obvious joint restrictions   SKIN: warm and dry without lesions    NEURO:  alert, approp, nl sensorium with  no motor or cerebellar deficits apparent.    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 My impression: no significant hyperinflation adjusted for age and body habitus   Labs ordered/ reviewed:      Chemistry      Component Value Date/Time   NA 139 06/22/2018 1230   NA 140 10/15/2017 1517   K 4.3 06/22/2018 1230   CL 102 06/22/2018 1230   CO2 32 06/22/2018 1230   BUN 22 06/22/2018 1230   BUN 18 10/15/2017 1517   CREATININE 0.97 06/22/2018 1230   CREATININE 0.96 10/01/2013 1909      Component Value Date/Time   CALCIUM 9.8 06/22/2018 1230   ALKPHOS 101 10/15/2017 1517   AST 36 10/15/2017 1517   ALT 31 10/15/2017 1517   BILITOT 0.3 10/15/2017 1517        Lab Results  Component Value Date   WBC 7.5 06/22/2018   HGB 14.6 06/22/2018   HCT 42.7 06/22/2018   MCV 88.2 06/22/2018   PLT 190.0 06/22/2018       EOS                                                               0.1                                    06/22/2018   No results found for: DDIMER    Lab Results  Component Value Date   TSH  2.34 06/22/2018     Lab Results  Component Value Date   PROBNP 24.0 06/22/2018       Lab Results  Component Value Date    ESRSEDRATE 2 06/22/2018          Assessment   No problem-specific Assessment & Plan notes found for this encounter.     Jason Gully, MD 06/22/2018

## 2018-06-22 NOTE — Patient Instructions (Signed)
Please remember to go to the lab and x-ray department   for your tests - we will call you with the results when they are available and consider CPST to sort this out  In the meantime, resume as much regular activity as you can so we can perform an accurate CPST

## 2018-06-22 NOTE — Assessment & Plan Note (Signed)
Onset fall 2019  - 06/22/2018   Walked RA  2 laps @  approx 288ft each @ very  fast pace  stopped due to end of study = "exhausted", no desat or obvious sob.    Symptoms are markedly disproportionate to objective findings and not clear to what extent this is actually a pulmonary  problem but pt does appear to have difficult to sort out respiratory symptoms of unknown origin for which  DDX  = almost all start with A and  include Adherence, Ace Inhibitors, Acid Reflux, Active Sinus Disease, Alpha 1 Antitripsin deficiency, Anxiety masquerading as Airways dz,  ABPA,  Allergy(esp in young), Aspiration (esp in elderly), Adverse effects of meds,  Active smoking or Vaping, A bunch of PE's/clot burden (a few small clots can't cause this syndrome unless there is already severe underlying pulm or vascular dz with poor reserve),  Anemia or thyroid disorder, plus two Bs  = Bronchiectasis and Beta blocker use..and one C= CHF     Adherence is always the initial "prime suspect" and is a multilayered concern that requires a "trust but verify" approach in every patient - starting with knowing how to use medications, especially inhalers, correctly, keeping up with refills and understanding the fundamental difference between maintenance and prns vs those medications only taken for a very short course and then stopped and not refilled.   ? Allergy/ asthma > risk factors include ? Childhood asthma/allergy ? Now has enough chronic remodeling to look more like copd or acod > needs pfts when  COVID - 19 restrictions have been lifted.  No bronchodilators for now  ? Alpha one AT def > need to check screen if pfts abnormal   ? Anemia/ thyroid dz > ruled out today  ? Anxiety/depression/ chronic fatigue/deconditioning  > usually at the bottom of this list of usual suspects but should be much higher on this pt's based on H and P and chart review and may interfere with adherence and also interpretation of response or lack thereof to  symptom management which can be quite subjective.    ? chf > excluded with bnp so low    Next step is full pfts/ cpst     Total time devoted to counseling  > 50 % of initial 60 min office visit:  reviewed case with pt/ directly observed portions of ambulatory 02 saturation study/  discussion of options/alternatives/ personally creating written customized instructions  in presence of pt  then going over those specific  Instructions directly with the pt including how to use all of the meds but in particular covering each new medication in detail and the difference between the maintenance= "automatic" meds and the prns using an action plan format for the latter (If this problem/symptom => do that organization reading Left to right).  Please see AVS from this visit for a full list of these instructions which I personally wrote for this pt and  are unique to this visit.

## 2018-06-23 NOTE — Progress Notes (Signed)
Spoke with the spouse and notified of recs per MW  She verbalized understanding and will inform the pt

## 2018-07-02 ENCOUNTER — Telehealth: Payer: Self-pay | Admitting: Emergency Medicine

## 2018-07-02 NOTE — Telephone Encounter (Unsigned)
Copied from [place] [id number]. Topic: Quick Communication - Rx Refill/Question >> Jul 02, 2018  9:59 AM Yvette Rack wrote: Medication: HYDROcodone-acetaminophen (NORCO/VICODIN) 5-325 MG tablet  Has the patient contacted their pharmacy? yes   Preferred Pharmacy (with phone number or street name): [facility], [place]. [phone] (Phone)  [phone] (Fax)  Agent: Please be advised that RX refills may take up to 3 business days. We ask that you follow-up with your pharmacy.

## 2018-07-03 NOTE — Telephone Encounter (Signed)
Please advise on refill. Controlled.

## 2018-07-05 ENCOUNTER — Other Ambulatory Visit: Payer: Self-pay | Admitting: Emergency Medicine

## 2018-07-05 DIAGNOSIS — G8929 Other chronic pain: Secondary | ICD-10-CM

## 2018-07-05 MED ORDER — HYDROCODONE-ACETAMINOPHEN 5-325 MG PO TABS: 1.0000 | ORAL_TABLET | Freq: Four times a day (QID) | ORAL | 0 refills | Status: DC | PRN

## 2018-07-05 NOTE — Telephone Encounter (Signed)
Refilled. Thanks!

## 2018-08-19 DIAGNOSIS — E291 Testicular hypofunction: Secondary | ICD-10-CM | POA: Diagnosis not present

## 2018-08-26 DIAGNOSIS — E291 Testicular hypofunction: Secondary | ICD-10-CM | POA: Diagnosis not present

## 2018-08-26 DIAGNOSIS — Z6821 Body mass index (BMI) 21.0-21.9, adult: Secondary | ICD-10-CM | POA: Diagnosis not present

## 2018-08-31 ENCOUNTER — Telehealth: Payer: Self-pay | Admitting: Emergency Medicine

## 2018-08-31 NOTE — Telephone Encounter (Signed)
Copied from [place] [id number]. Topic: General - Other >> Aug 28, 2018 11:05 AM Pauline Good wrote: Reason for CRM: pt want to know status of referral

## 2018-09-01 NOTE — Telephone Encounter (Signed)
Pt is requesting referral for oncology.  Did not see anything mentioned in the previous note.

## 2018-09-02 NOTE — Telephone Encounter (Signed)
What is the reason for the referral?  Please inquire as we need a reason to refer.  Thanks.

## 2018-09-03 NOTE — Telephone Encounter (Signed)
Called pt to inquire about oncology referral, no answer

## 2018-09-17 ENCOUNTER — Other Ambulatory Visit: Payer: Self-pay

## 2018-09-17 DIAGNOSIS — Z129 Encounter for screening for malignant neoplasm, site unspecified: Secondary | ICD-10-CM

## 2018-09-30 ENCOUNTER — Telehealth (HOSPITAL_COMMUNITY): Payer: Self-pay | Admitting: *Deleted

## 2018-09-30 NOTE — Telephone Encounter (Signed)
Patient called and left message in regards to cancer screenings and referrals. Called patient back and told him about our upcoming free prostate cancer screening. Patient stated he has not had a PSA completed within the past year. Scheduled patient for the Prostate cancer screening on 10/26/2018. Told patient that he will need to follow-up with his PCP on referrals and to have other cancer screenings completed. Told him the free skin cancer screenings are in May and June every year. Patient verbalized understanding.

## 2018-10-14 ENCOUNTER — Telehealth: Payer: Self-pay | Admitting: *Deleted

## 2018-10-14 NOTE — Telephone Encounter (Signed)
Called patient to schedule AWV.  Patient stated he was upset and done with practice, stated he has been trying to get referrals for 3 months.  Advise patient I could look into the referral for him and see what is going on.  Patient declined and said was done with practice.

## 2018-10-26 ENCOUNTER — Other Ambulatory Visit: Payer: Self-pay

## 2018-10-26 ENCOUNTER — Other Ambulatory Visit: Payer: Self-pay | Admitting: *Deleted

## 2018-10-26 DIAGNOSIS — Z125 Encounter for screening for malignant neoplasm of prostate: Secondary | ICD-10-CM

## 2018-10-26 NOTE — Progress Notes (Signed)
Patient: Jason Blevins           Date of Birth: [birth year]           MRN: [id number] Visit Date: 10/26/2018 PCP: Horald Pollen, MD  Prostate Cancer Screening Date of last physical exam: (6 months ago) Date of last rectal exam: (2 years ago) Have you ever had any of the following?: (cousin with prostate cancer, ) Have you ever had or been told you have an allergy to latex products?: No Are you currently taking any natural prostate preparations?: No Are you currently experiencing any urinary symptoms?: No  Prostate Exam Exam not completed.  Patient's History Patient Active Problem List   Diagnosis Date Noted  . Dyspnea on exertion 04/07/2018  . Cough 04/07/2018  . General weakness 10/15/2017  . Decreased stamina 10/15/2017  . Bite, insect 08/06/2016  . Itching 08/06/2016  . Chronic right-sided low back pain without sciatica 07/03/2016   Past Medical History:  Diagnosis Date  . Blood transfusion without reported diagnosis     Family History  Problem Relation Age of Onset  . Cancer Mother        unsure what type   . Heart disease Maternal Grandfather     Social History   Occupational History  . Not on file  Tobacco Use  . Smoking status: Never Smoker  . Smokeless tobacco: Never Used  Substance and Sexual Activity  . Alcohol use: Yes    Comment: 2-3  . Drug use: No  . Sexual activity: Yes

## 2018-10-27 LAB — PSA: Prostate Specific Ag, Serum: 0.9 ng/mL (ref 0.0–4.0)

## 2018-11-04 ENCOUNTER — Encounter: Payer: Self-pay | Admitting: Nurse Practitioner

## 2018-11-04 ENCOUNTER — Other Ambulatory Visit: Payer: Self-pay

## 2018-11-04 ENCOUNTER — Ambulatory Visit (INDEPENDENT_AMBULATORY_CARE_PROVIDER_SITE_OTHER): Payer: PPO | Admitting: Nurse Practitioner

## 2018-11-04 VITALS — BP 132/84 | HR 61 | Temp 97.7°F | Ht 71.0 in | Wt 149.6 lb

## 2018-11-04 DIAGNOSIS — F329 Major depressive disorder, single episode, unspecified: Secondary | ICD-10-CM | POA: Diagnosis not present

## 2018-11-04 DIAGNOSIS — G8929 Other chronic pain: Secondary | ICD-10-CM

## 2018-11-04 DIAGNOSIS — M545 Low back pain, unspecified: Secondary | ICD-10-CM

## 2018-11-04 DIAGNOSIS — F419 Anxiety disorder, unspecified: Secondary | ICD-10-CM | POA: Diagnosis not present

## 2018-11-04 DIAGNOSIS — F32A Depression, unspecified: Secondary | ICD-10-CM

## 2018-11-04 DIAGNOSIS — Z1211 Encounter for screening for malignant neoplasm of colon: Secondary | ICD-10-CM

## 2018-11-04 DIAGNOSIS — R5383 Other fatigue: Secondary | ICD-10-CM

## 2018-11-04 DIAGNOSIS — R5381 Other malaise: Secondary | ICD-10-CM | POA: Diagnosis not present

## 2018-11-04 DIAGNOSIS — Z1212 Encounter for screening for malignant neoplasm of rectum: Secondary | ICD-10-CM

## 2018-11-04 DIAGNOSIS — Z13220 Encounter for screening for lipoid disorders: Secondary | ICD-10-CM | POA: Diagnosis not present

## 2018-11-04 DIAGNOSIS — Z23 Encounter for immunization: Secondary | ICD-10-CM

## 2018-11-04 NOTE — Progress Notes (Signed)
Careteam: Patient Care Team: Lauree Chandler, NP as PCP - General (Geriatric Medicine) Melissa Noon, [place] as Referring Physician (Optometry) Tanda Rockers, MD as Consulting Physician (Pulmonary Disease)  Advanced Directive information Does Patient Have a Medical Advance Directive?: Yes, Type of Advance Directive: [place];Living will, Does patient want to make changes to medical advance directive?: No - Patient declined  No Known Allergies  Chief Complaint  Patient presents with  . Establish Care    New patient establish care. Patient states "hysically , I have experienced a sharp decline within the last year"  . Immunizations    Flu vaccine      HPI: Patient is a 68 y.o. male seen in the office today to establish care. Previous provider retired and his wife is an establish pt here.   Last AWV was April 2019  Would like an oncology referral due to a "dramatic decline"  Went to see a holistic doctor and was started on most of his supplements. They would take blood and change supplement accordingly. Would see yearly but she retired and would continue to consult but eventually was unable to do that.   Had back surgery several years ago and will use hydrocodone-apap very sparely but will use.  Dr Cheri Rous was the surgeon who did surgery on his back. He fells that overall back pain is controlled. Laminectomy   Anemia- "not processing iron" therefore on supplement. Diagnosised due to no leg hair on the lower half of his body.   Insomnia- uses melatonin 1 mg at bedtime.   Anxiety/depression- admits to a lot of anxiety. Keeps busy in the community, focuses on students. Limited exercising due to COVID.  Has been on antidepressants in the past and felt like it was very beneficial but then eventually felt like they caused more problems then good. May would benefit from them.    Hx of skin cancers- unsure what type of cancer, he is unsure who he sees but will  let us know.   Decrease in stamina was evaluated by pulmonary   Did not have colonoscopy.   Recently got PSA for screening. Has noted a decrease in flow;  at night gets up once to urinate.  Review of Systems:  Review of Systems  Constitutional: Negative for chills, fever and weight loss.  HENT: Negative for tinnitus.   Respiratory: Negative for cough, sputum production and shortness of breath.   Cardiovascular: Negative for chest pain, palpitations and leg swelling.  Gastrointestinal: Negative for abdominal pain, constipation, diarrhea and heartburn.  Genitourinary: Negative for dysuria, frequency and urgency.  Musculoskeletal: Negative for back pain, falls, joint pain and myalgias.  Skin: Negative.   Neurological: Negative for dizziness and headaches.  Psychiatric/Behavioral: Positive for depression. Negative for memory loss. The patient is nervous/anxious. The patient does not have insomnia.     Past Medical History:  Diagnosis Date  . Blood transfusion without reported diagnosis   . Skin cancer    Per [facility] New Patient Packet   Past Surgical History:  Procedure Laterality Date  . MOHS SURGERY  1994   Per [facility] New Patient Packet  . SPINE SURGERY     Social History:   reports that he has never smoked. He has never used smokeless tobacco. He reports current alcohol use. He reports that he does not use drugs.  Family History  Problem Relation Age of Onset  . Cancer Mother        unsure what type   .  Arthritis Sister   . Heart disease Maternal Grandfather     Medications: Patient's Medications  New Prescriptions   No medications on file  Previous Medications   ASCORBIC ACID (VITAMIN C WITH ROSE HIPS) 500 MG TABLET    1000/500 mg 1 by mouth twice daily   BETAINE PO    Take 521 mg by mouth 2 (two) times daily.   CHOLECALCIFEROL 25 MCG (1000 UT) CAPSULE    Take 2,000 Units by mouth daily.   DHEA 25 MG CAPS    Take 50 mg by mouth daily.   HYDROCODONE-ACETAMINOPHEN  (NORCO/VICODIN) 5-325 MG TABLET    Take 1 tablet by mouth every 6 (six) hours as needed for moderate pain.   IRON PO    1,100 mg. 2 by mouth in the am and 1 by mouth in the pm   MAGNESIUM 250 MG TABS    Take 1 tablet by mouth daily.   MELATONIN PO    Take by mouth. 1-3 mg as needed   MULTIPLE VITAMINS-MINERALS (ZINC PO)    Take 30 mg by mouth 2 (two) times daily.   TURMERIC 500 MG CAPS    Take 1 capsule by mouth daily.   UNABLE TO FIND    Med Name: Russion choice immune, 2 by mouth in the am, 2 by mouth in the pm   UNABLE TO FIND    Med Name: Dicalcium Malate 150/250 mg, 1 by mouth twice daily   UNABLE TO FIND    Med Name: Iodoral 12.5 mg 1 by mouth daily   UNABLE TO FIND    Med Name: Itis Care 1 gram 1 by mouth twice daily   UNABLE TO FIND    Med Name: Nutrient 950, 3 by mouth in the am, 2 by mouth in the pm   UNABLE TO FIND    Med Name: Pyridoxal 5-Phosphate 50 mg, 2 by mouth in the pm   UNABLE TO FIND    Med Name: YES EFA's 740 mg 1 by mouth twice daily   VITAMIN A 16109 UNIT CAPSULE    Take 10,000 Units by mouth daily.  Modified Medications   No medications on file  Discontinued Medications   No medications on file    Physical Exam:  Vitals:   11/04/18 1320  BP: 132/84  Pulse: 61  Temp: 97.7 F (36.5 C)  TempSrc: Temporal  SpO2: 98%  Weight: 149 lb 9.6 oz (67.9 kg)  Height: 5' 11"  (1.803 m)   Body mass index is 20.86 kg/m. Wt Readings from Last 3 Encounters:  11/04/18 149 lb 9.6 oz (67.9 kg)  06/22/18 149 lb (67.6 kg)  04/07/18 151 lb 6.4 oz (68.7 kg)    Physical Exam Constitutional:      General: He is not in acute distress.    Appearance: He is well-developed. He is not diaphoretic.  HENT:     Head: Normocephalic and atraumatic.     Mouth/Throat:     Pharynx: No oropharyngeal exudate.  Eyes:     Conjunctiva/sclera: Conjunctivae normal.     Pupils: Pupils are equal, round, and reactive to light.  Neck:     Musculoskeletal: Normal range of motion and neck  supple.  Cardiovascular:     Rate and Rhythm: Normal rate and regular rhythm.     Heart sounds: Normal heart sounds.  Pulmonary:     Effort: Pulmonary effort is normal.     Breath sounds: Normal breath sounds.  Abdominal:  General: Bowel sounds are normal.     Palpations: Abdomen is soft.  Musculoskeletal:        General: No tenderness.  Skin:    General: Skin is warm and dry.  Neurological:     Mental Status: He is alert and oriented to person, place, and time.     Labs reviewed: Basic Metabolic Panel: Recent Labs    06/22/18 1230  NA 139  K 4.3  CL 102  CO2 32  GLUCOSE 86  BUN 22  CREATININE 0.97  CALCIUM 9.8  TSH 2.34   Liver Function Tests: No results for input(s): AST, ALT, ALKPHOS, BILITOT, PROT, ALBUMIN in the last 8760 hours. No results for input(s): LIPASE, AMYLASE in the last 8760 hours. No results for input(s): AMMONIA in the last 8760 hours. CBC: Recent Labs    12/29/17 1425 06/22/18 1230  WBC 10.7* 7.5  NEUTROABS  --  4.7  HGB 15.5* 14.6  HCT 45.9* 42.7  MCV 89.1 88.2  PLT  --  190.0   Lipid Panel: No results for input(s): CHOL, HDL, LDLCALC, TRIG, CHOLHDL, LDLDIRECT in the last 8760 hours. TSH: Recent Labs    06/22/18 1230  TSH 2.34   A1C: No results found for: HGBA1C   Assessment/Plan 1. Need for influenza vaccination - Flu Vaccine QUAD High Dose(Fluad)  2. Screening for colorectal cancer - Ambulatory referral to Gastroenterology  3. Malaise and fatigue -ongoing, he had a workup that showed low Testerone but declined referral to endocrinologist. States he has been eating well and exercising. He was referred to pulmonary and cardiology. He did not follow up with cardiology and pulmonary without acute findings. He had PSA done which was normal. This result was given to pt.  - CBC with Differential/Platelet; Future - CMP with eGFR([facility]); Future - TSH; Future  4. Screening for lipid disorders - Lipid panel; Future  5.  Chronic right-sided low back pain without sciatica Ongoing, uses hydrocodone- apap occasionally for sever pain.  6. Anxiety and depression Per epic review in the past has not wanted to take medication or see a psychiatrist, the same is true today. States 'depending on what happens in the world I may need to be on medication later"  Next appt: 4-6 weeks for AWV and EV Jason Blevins K. [place], [facility] [phone]

## 2018-11-04 NOTE — Patient Instructions (Signed)
Make sure you are eating enough calories throughout the day- proper nutrition  Make sure to stay active. 20-30 mins of physical activity recommended  Consider mediation and to start a gratitude journal    Follow up in 4-6 weeks for awv and physical- to get fasting blood work prior to appt

## 2018-11-05 ENCOUNTER — Encounter: Payer: Self-pay | Admitting: Gastroenterology

## 2018-12-03 ENCOUNTER — Other Ambulatory Visit: Payer: Self-pay

## 2018-12-03 ENCOUNTER — Ambulatory Visit (AMBULATORY_SURGERY_CENTER): Payer: PPO | Admitting: *Deleted

## 2018-12-03 VITALS — Temp 96.9°F | Ht 71.0 in | Wt 154.4 lb

## 2018-12-03 DIAGNOSIS — Z13220 Encounter for screening for lipoid disorders: Secondary | ICD-10-CM

## 2018-12-03 DIAGNOSIS — R5383 Other fatigue: Secondary | ICD-10-CM

## 2018-12-03 DIAGNOSIS — R5381 Other malaise: Secondary | ICD-10-CM

## 2018-12-03 DIAGNOSIS — Z1211 Encounter for screening for malignant neoplasm of colon: Secondary | ICD-10-CM

## 2018-12-03 DIAGNOSIS — Z1159 Encounter for screening for other viral diseases: Secondary | ICD-10-CM

## 2018-12-03 NOTE — Progress Notes (Signed)
No egg or soy allergy known to patient  No issues with past sedation with any surgeries  or procedures, no intubation problems  No diet pills per patient No home 02 use per patient  No blood thinners per patient  Pt denies issues with constipation  No A fib or A flutter  EMMI video sent to pt's e mail   Due to the COVID-19 pandemic we are asking patients to follow these guidelines. Please only bring one care partner. Please be aware that your care partner may wait in the car in the parking lot or if they feel like they will be too hot to wait in the car, they may wait in the lobby on the 4th floor. All care partners are required to wear a mask the entire time (we do not have any that we can provide them), they need to practice social distancing, and we will do a Covid check for all patient's and care partners when you arrive. Also we will check their temperature and your temperature. If the care partner waits in their car they need to stay in the parking lot the entire time and we will call them on their cell phone when the patient is ready for discharge so they can bring the car to the front of the building. Also all patient's will need to wear a mask into building.  COVID SCREENING 12/15/18, 3:10 PM  SAMPLE PAPER FOR OTC MIRALAX PROVIDED.

## 2018-12-04 ENCOUNTER — Encounter: Payer: Self-pay | Admitting: Gastroenterology

## 2018-12-04 ENCOUNTER — Other Ambulatory Visit: Payer: PPO

## 2018-12-04 DIAGNOSIS — R5381 Other malaise: Secondary | ICD-10-CM | POA: Diagnosis not present

## 2018-12-04 DIAGNOSIS — R5383 Other fatigue: Secondary | ICD-10-CM | POA: Diagnosis not present

## 2018-12-04 DIAGNOSIS — Z13220 Encounter for screening for lipoid disorders: Secondary | ICD-10-CM | POA: Diagnosis not present

## 2018-12-05 LAB — CBC WITH DIFFERENTIAL/PLATELET
Absolute Monocytes: 486 cells/uL (ref 200–950)
Basophils Absolute: 53 cells/uL (ref 0–200)
Basophils Relative: 0.7 %
Eosinophils Absolute: 220 cells/uL (ref 15–500)
Eosinophils Relative: 2.9 %
HCT: 45 % (ref 38.5–50.0)
Hemoglobin: 15.3 g/dL (ref 13.2–17.1)
Lymphs Abs: 2706 cells/uL (ref 850–3900)
MCH: 29.9 pg (ref 27.0–33.0)
MCHC: 34 g/dL (ref 32.0–36.0)
MCV: 88.1 fL (ref 80.0–100.0)
MPV: 12.4 fL (ref 7.5–12.5)
Monocytes Relative: 6.4 %
Neutro Abs: 4134 cells/uL (ref 1500–7800)
Neutrophils Relative %: 54.4 %
Platelets: 239 10*3/uL (ref 140–400)
RBC: 5.11 10*6/uL (ref 4.20–5.80)
RDW: 12.8 % (ref 11.0–15.0)
Total Lymphocyte: 35.6 %
WBC: 7.6 10*3/uL (ref 3.8–10.8)

## 2018-12-05 LAB — COMPLETE METABOLIC PANEL WITH GFR
AG Ratio: 1.8 (calc) (ref 1.0–2.5)
ALT: 28 U/L (ref 9–46)
AST: 34 U/L (ref 10–35)
Albumin: 4.4 g/dL (ref 3.6–5.1)
Alkaline phosphatase (APISO): 73 U/L (ref 35–144)
BUN: 22 mg/dL (ref 7–25)
CO2: 32 mmol/L (ref 20–32)
Calcium: 9.7 mg/dL (ref 8.6–10.3)
Chloride: 103 mmol/L (ref 98–110)
Creat: 0.93 mg/dL (ref 0.70–1.18)
GFR, Est African American: 96 mL/min/{1.73_m2} (ref >=60)
GFR, Est Non African American: 83 mL/min/{1.73_m2} (ref >=60)
Globulin: 2.4 g/dL (calc) (ref 1.9–3.7)
Glucose, Bld: 78 mg/dL (ref 65–99)
Potassium: 4.6 mmol/L (ref 3.5–5.3)
Sodium: 142 mmol/L (ref 135–146)
Total Bilirubin: 0.8 mg/dL (ref 0.2–1.2)
Total Protein: 6.8 g/dL (ref 6.1–8.1)

## 2018-12-05 LAB — TSH: TSH: 3.15 mIU/L (ref 0.40–4.50)

## 2018-12-05 LAB — LIPID PANEL
Cholesterol: 197 mg/dL (ref <200)
HDL: 86 mg/dL (ref >=40)
LDL Cholesterol (Calc): 97 mg/dL (calc)
Non-HDL Cholesterol (Calc): 111 mg/dL (calc) (ref <130)
Total CHOL/HDL Ratio: 2.3 (calc) (ref <5.0)
Triglycerides: 55 mg/dL (ref <150)

## 2018-12-11 ENCOUNTER — Encounter: Payer: Self-pay | Admitting: Nurse Practitioner

## 2018-12-11 ENCOUNTER — Other Ambulatory Visit: Payer: Self-pay

## 2018-12-11 ENCOUNTER — Ambulatory Visit (INDEPENDENT_AMBULATORY_CARE_PROVIDER_SITE_OTHER): Payer: PPO | Admitting: Nurse Practitioner

## 2018-12-11 VITALS — BP 122/72 | HR 55 | Temp 98.0°F | Resp 18 | Ht 71.0 in | Wt 154.4 lb

## 2018-12-11 DIAGNOSIS — Z0000 Encounter for general adult medical examination without abnormal findings: Secondary | ICD-10-CM

## 2018-12-11 DIAGNOSIS — H938X1 Other specified disorders of right ear: Secondary | ICD-10-CM

## 2018-12-11 NOTE — Progress Notes (Signed)
Subjective:   Jason Blevins is a 68 y.o. male who presents for Medicare Annual/Subsequent preventive examination.  Review of Systems:   Cardiac Risk Factors include: advanced age (>91men, >20 women);family history of premature cardiovascular disease;male gender     Objective:    Vitals: BP 122/72   Pulse (!) 55   Temp 98 F (36.7 C) (Oral)   Resp 18   Ht 5\' 11"  (1.803 m)   Wt 154 lb 6.4 oz (70 kg)   SpO2 98%   BMI 21.53 kg/m   Body mass index is 21.53 kg/m.  Advanced Directives 12/11/2018 12/11/2018 11/04/2018  Does Patient Have a Medical Advance Directive? Yes Yes Yes  Type of Advance Directive - - [place];Living will  Does patient want to make changes to medical advance directive? - - No - Patient declined  Copy of [place] in Chart? - - No - copy requested    Tobacco Social History   Tobacco Use  Smoking Status Never Smoker  Smokeless Tobacco Never Used     Counseling given: Not Answered   Clinical Intake:  Pre-visit preparation completed: Yes  Pain : No/denies pain     BMI - recorded: 21.53 Nutritional Status: BMI of 19-24  Normal Nutritional Risks: None Diabetes: No  How often do you need to have someone help you when you read instructions, pamphlets, or other written materials from your doctor or pharmacy?: 1 - Never What is the last grade level you completed in school?: masters degree  Interpreter Needed?: No     Past Medical History:  Diagnosis Date  . Allergy   . Anxiety   . Asthma    AS CHILD  . Blood transfusion without reported diagnosis   . Cataract    BEGINNING  . Skin cancer    Per [facility] New Patient Packet   Past Surgical History:  Procedure Laterality Date  . MOHS SURGERY  1994   Per [facility] New Patient Packet  . SPINE SURGERY     Family History  Problem Relation Age of Onset  . Cancer Mother        unsure what type   . Arthritis Sister   . Cancer Sister 60       skin  . Heart  disease Maternal Grandfather   . Colon cancer Neg Hx   . Colon polyps Neg Hx   . Esophageal cancer Neg Hx   . Rectal cancer Neg Hx   . Stomach cancer Neg Hx    Social History   Socioeconomic History  . Marital status: Married    Spouse name: Not on file  . Number of children: Not on file  . Years of education: Not on file  . Highest education level: Not on file  Occupational History  . Not on file  Social Needs  . Financial resource strain: Not on file  . Food insecurity    Worry: Not on file    Inability: Not on file  . Transportation needs    Medical: Not on file    Non-medical: Not on file  Tobacco Use  . Smoking status: Never Smoker  . Smokeless tobacco: Never Used  Substance and Sexual Activity  . Alcohol use: Yes    Comment: 2-3 nightly   . Drug use: No  . Sexual activity: Yes  Lifestyle  . Physical activity    Days per week: Not on file    Minutes per session: Not on file  .  Stress: Not on file  Relationships  . Social Herbalist on phone: Not on file    Gets together: Not on file    Attends religious service: Not on file    Active member of club or organization: Not on file    Attends meetings of clubs or organizations: Not on file    Relationship status: Not on file  Other Topics Concern  . Not on file  Social History Narrative   Per [facility] New Patient Packet abstracted 11/04/2018      Diet: N/A      Caffeine: Yes      Married, if yes what year: Separated, married in [place] you live in a house, apartment, assisted living, condo, trailer, ect: House, one stories, one person       Pets: Dog, bassett hound       Current/Past profession: Conservator, museum/gallery, educator       Exercise: walking/workout whe possible          Living Will: yes   DNR: yes   POA/HPOA: yes      Functional Status:   Do you have difficulty bathing or dressing yourself? No   Do you have difficulty preparing food or eating? No   Do you have difficulty managing  your medications? No   Do you have difficulty managing your finances? No    Do you have difficulty affording your medications? No    Outpatient Encounter Medications as of 12/11/2018  Medication Sig  . Ascorbic Acid (VITAMIN C WITH ROSE HIPS) 500 MG tablet 1000/500 mg 1 by mouth twice daily  . BETAINE PO Take 521 mg by mouth 2 (two) times daily.  . Cholecalciferol 25 MCG (1000 UT) capsule Take 2,000 Units by mouth daily.  Marland Kitchen DHEA 25 MG CAPS Take 50 mg by mouth daily.  Marland Kitchen HYDROcodone-acetaminophen (NORCO/VICODIN) 5-325 MG tablet Take 1 tablet by mouth every 6 (six) hours as needed for moderate pain.  . IRON PO 1,100 mg. 2 by mouth in the am and 1 by mouth in the pm  . Magnesium 250 MG TABS Take 1 tablet by mouth daily.  Marland Kitchen MELATONIN PO Take by mouth. 1-3 mg as needed  . Multiple Vitamins-Minerals (ZINC PO) Take 30 mg by mouth 2 (two) times daily.  . Turmeric 500 MG CAPS Take 1 capsule by mouth daily.  Marland Kitchen UNABLE TO FIND Med Name: Russion choice immune, 2 by mouth in the am, 2 by mouth in the pm  . UNABLE TO FIND Med Name: Dicalcium Malate 150/250 mg, 1 by mouth twice daily  . UNABLE TO FIND Med Name: Iodoral 12.5 mg 1 by mouth daily  . UNABLE TO FIND Med Name: Itis Care 1 gram 1 by mouth twice daily  . UNABLE TO FIND Med Name: Nutrient 950, 3 by mouth in the am, 2 by mouth in the pm  . UNABLE TO FIND Med Name: Pyridoxal 5-Phosphate 50 mg, 2 by mouth in the pm  . UNABLE TO FIND Med Name: YES EFA's 740 mg 1 by mouth twice daily  . vitamin A 10000 UNIT capsule Take 10,000 Units by mouth daily.   No facility-administered encounter medications on file as of 12/11/2018.     Activities of Daily Living In your present state of health, do you have any difficulty performing the following activities: 12/11/2018  Hearing? N  Vision? N  Difficulty concentrating or making decisions? N  Walking or  climbing stairs? N  Dressing or bathing? N  Doing errands, shopping? N  Preparing Food and eating ? N   Using the Toilet? N  In the past six months, have you accidently leaked urine? N  Do you have problems with loss of bowel control? N  Managing your Medications? N  Managing your Finances? N  Housekeeping or managing your Housekeeping? N  Some recent data might be hidden    Patient Care Team: Lauree Chandler, NP as PCP - General (Geriatric Medicine) Melissa Noon, [place] as Referring Physician (Optometry) Tanda Rockers, MD as Consulting Physician (Pulmonary Disease)   Assessment:   This is a routine wellness examination for Jassiel.  Exercise Activities and Dietary recommendations Current Exercise Habits: Home exercise routine, Type of exercise: calisthenics;walking, Time (Minutes): 20, Frequency (Times/Week): 3, Weekly Exercise (Minutes/Week): 60, Intensity: Mild  Goals    . Increase physical activity     Increase activity to 30 mins daily        Fall Risk Fall Risk  12/11/2018 12/11/2018 11/04/2018 04/07/2018 12/29/2017  Falls in the past year? 0 0 0 0 0  Number falls in past yr: - - 0 - -  Injury with Fall? - - 0 - -  Follow up - - - Falls evaluation completed -   Is the patient's home free of loose throw rugs in walkways, pet beds, electrical cords, etc?   yes      Grab bars in the bathroom? no      Handrails on the stairs?   yes      Adequate lighting?   yes  Timed Get Up and Go Performed: na  Depression Screen PHQ 2/9 Scores 12/11/2018 12/11/2018 11/04/2018 04/07/2018  PHQ - 2 Score 0 0 0 0    Cognitive Function MMSE - Mini Mental State Exam 12/11/2018  Orientation to time 5  Orientation to Place 5  Registration 3  Attention/ Calculation 5  Recall 3  Language- name 2 objects 2  Language- repeat 1  Language- follow 3 step command 3  Language- read & follow direction 1  Write a sentence 1  Copy design 1  Total score 30        Immunization History  Administered Date(s) Administered  . Fluad Quad(high Dose 65+) 11/04/2018  . Hepatitis B 02/05/2004  .  Influenza,inj,Quad PF,6+ Mos 10/01/2013, 10/21/2014, 10/15/2017  . Influenza-Unspecified 11/03/2016  . Pneumococcal Conjugate-13 10/01/2013  . Pneumococcal Polysaccharide-23 10/21/2014  . Tdap 10/01/2013    Qualifies for Shingles Vaccine? yes  Screening Tests Health Maintenance  Topic Date Due  . COLONOSCOPY  04/07/2019 (Originally 08/27/1998)  . TETANUS/TDAP  10/02/2023  . INFLUENZA VACCINE  Completed  . Hepatitis C Screening  Completed  . PNA vac Low Risk Adult  Completed   Cancer Screenings: Lung: Low Dose CT Chest recommended if Age 45-80 years, 30 pack-year currently smoking OR have quit w/in 15years. Patient does not qualify. Colorectal: not done, interested in completing cologuard  Additional Screenings:  Hepatitis C Screening: has not been done.       Plan:      I have personally reviewed and noted the following in the patient's chart:   . Medical and social history . Use of alcohol, tobacco or illicit drugs  . Current medications and supplements . Functional ability and status . Nutritional status . Physical activity . Advanced directives . List of other physicians . Hospitalizations, surgeries, and ER visits in previous 12 months . Vitals . Screenings to  include cognitive, depression, and falls . Referrals and appointments  In addition, I have reviewed and discussed with patient certain preventive protocols, quality metrics, and best practice recommendations. A written personalized care plan for preventive services as well as general preventive health recommendations were provided to patient.     Lauree Chandler, NP  12/11/2018

## 2018-12-11 NOTE — Progress Notes (Signed)
Provider: Lauree Chandler, NP  Patient Care Team: Lauree Chandler, NP as PCP - General (Geriatric Medicine) Melissa Noon, [place] as Referring Physician (Optometry) Tanda Rockers, MD as Consulting Physician (Pulmonary Disease)  Extended Emergency Contact Information Primary Emergency Contact: Shanon Ace Address: [address]          [place], [place] [address] Johnnette Litter of [place] Phone: [phone] Relation: Spouse Secondary Emergency Contact: Cyndi Bender Address: [place] [address] [place] Phone: [phone] Relation: Sister No Known Allergies Code Status: DNR  Goals of Care: Advanced Directive information Advanced Directives 12/11/2018  Does Patient Have a Medical Advance Directive? Yes  Type of Advance Directive -  Does patient want to make changes to medical advance directive? -  Copy of [place] in Chart? -     Chief Complaint  Patient presents with  . Annual Exam    Annual Exam    HPI: Patient is a 68 y.o. male seen in today for an annual wellness exam.    Diet? Attempts healthy diet.   Exercise? Not routinely    Dentition: occasionally will see dentist.  Had been 4 years but recently saw  Ophthalmology appt:  Yearly  Stamina issues, went to pulmonary for evaluation but did not get further testing due to COVID.   Routine specialist: dermatologist.   Hx of melanoma   Depression screen [facility] 2/9 12/11/2018 12/11/2018 11/04/2018 04/07/2018 12/29/2017  Decreased Interest 0 0 0 0 0  Down, Depressed, Hopeless 0 0 0 0 0  PHQ - 2 Score 0 0 0 0 0    Fall Risk  12/11/2018 12/11/2018 11/04/2018 04/07/2018 12/29/2017  Falls in the past year? 0 0 0 0 0  Number falls in past yr: - - 0 - -  Injury with Fall? - - 0 - -  Follow up - - - Falls evaluation completed -   MMSE - [place] Exam 12/11/2018  Orientation to time 5  Orientation to Place 5  Registration 3  Attention/  Calculation 5  Recall 3  Language- name 2 objects 2  Language- repeat 1  Language- follow 3 step command 3  Language- read & follow direction 1  Write a sentence 1  Copy design 1  Total score 30     Health Maintenance  Topic Date Due  . COLONOSCOPY  04/07/2019 (Originally 08/27/1998)  . TETANUS/TDAP  10/02/2023  . INFLUENZA VACCINE  Completed  . Hepatitis C Screening  Completed  . PNA vac Low Risk Adult  Completed    Past Medical History:  Diagnosis Date  . Allergy   . Anxiety   . Asthma    AS CHILD  . Blood transfusion without reported diagnosis   . Cataract    BEGINNING  . Skin cancer    Per [facility] New Patient Packet    Past Surgical History:  Procedure Laterality Date  . MOHS SURGERY  1994   Per [facility] New Patient Packet  . SPINE SURGERY      Social History   Socioeconomic History  . Marital status: Married    Spouse name: Not on file  . Number of children: Not on file  . Years of education: Not on file  . Highest education level: Not on file  Occupational History  . Not on file  Social Needs  . Financial resource strain: Not on file  .  Food insecurity    Worry: Not on file    Inability: Not on file  . Transportation needs    Medical: Not on file    Non-medical: Not on file  Tobacco Use  . Smoking status: Never Smoker  . Smokeless tobacco: Never Used  Substance and Sexual Activity  . Alcohol use: Yes    Comment: 2-3 nightly   . Drug use: No  . Sexual activity: Yes  Lifestyle  . Physical activity    Days per week: Not on file    Minutes per session: Not on file  . Stress: Not on file  Relationships  . Social Herbalist on phone: Not on file    Gets together: Not on file    Attends religious service: Not on file    Active member of club or organization: Not on file    Attends meetings of clubs or organizations: Not on file    Relationship status: Not on file  Other Topics Concern  . Not on file  Social History Narrative   Per [facility]  New Patient Packet abstracted 11/04/2018      Diet: N/A      Caffeine: Yes      Married, if yes what year: Separated, married in [place] you live in a house, apartment, assisted living, condo, trailer, ect: House, one stories, one person       Pets: Dog, bassett hound       Current/Past profession: Conservator, museum/gallery, educator       Exercise: walking/workout whe possible          Living Will: yes   DNR: yes   POA/HPOA: yes      Functional Status:   Do you have difficulty bathing or dressing yourself? No   Do you have difficulty preparing food or eating? No   Do you have difficulty managing your medications? No   Do you have difficulty managing your finances? No    Do you have difficulty affording your medications? No    Family History  Problem Relation Age of Onset  . Cancer Mother        unsure what type   . Arthritis Sister   . Cancer Sister 60       skin  . Heart disease Maternal Grandfather   . Colon cancer Neg Hx   . Colon polyps Neg Hx   . Esophageal cancer Neg Hx   . Rectal cancer Neg Hx   . Stomach cancer Neg Hx     Review of Systems:  Review of Systems  Constitutional: Negative for chills, fever and weight loss.  HENT: Negative for tinnitus.   Respiratory: Negative for cough, sputum production and shortness of breath.   Cardiovascular: Negative for chest pain, palpitations and leg swelling.  Gastrointestinal: Negative for abdominal pain, constipation, diarrhea and heartburn.  Genitourinary: Negative for dysuria, frequency and urgency.  Musculoskeletal: Negative for back pain, falls, joint pain and myalgias.  Skin: Negative.   Neurological: Negative for dizziness and headaches.  Psychiatric/Behavioral: Negative for depression and memory loss. The patient does not have insomnia.      Allergies as of 12/11/2018   No Known Allergies     Medication List       Accurate as of December 11, 2018 11:21 AM. If you have any questions, ask your nurse or  doctor.        BETAINE PO Take 521 mg by  mouth 2 (two) times daily.   Cholecalciferol 25 MCG (1000 UT) capsule Take 2,000 Units by mouth daily.   DHEA 25 MG Caps Take 50 mg by mouth daily.   HYDROcodone-acetaminophen 5-325 MG tablet Commonly known as: NORCO/VICODIN Take 1 tablet by mouth every 6 (six) hours as needed for moderate pain.   IRON PO 1,100 mg. 2 by mouth in the am and 1 by mouth in the pm   Magnesium 250 MG Tabs Take 1 tablet by mouth daily.   MELATONIN PO Take by mouth. 1-3 mg as needed   Turmeric 500 MG Caps Take 1 capsule by mouth daily.   UNABLE TO FIND Med Name: Russion choice immune, 2 by mouth in the am, 2 by mouth in the pm   UNABLE TO FIND Med Name: Dicalcium Malate 150/250 mg, 1 by mouth twice daily   UNABLE TO FIND Med Name: Iodoral 12.5 mg 1 by mouth daily   UNABLE TO FIND Med Name: Itis Care 1 gram 1 by mouth twice daily   UNABLE TO FIND Med Name: Nutrient 950, 3 by mouth in the am, 2 by mouth in the pm   UNABLE TO FIND Med Name: Pyridoxal 5-Phosphate 50 mg, 2 by mouth in the pm   UNABLE TO FIND Med Name: YES EFA's 740 mg 1 by mouth twice daily   vitamin A 10000 UNIT capsule Take 10,000 Units by mouth daily.   vitamin C with rose hips 500 MG tablet 1000/500 mg 1 by mouth twice daily   ZINC PO Take 30 mg by mouth 2 (two) times daily.         Physical Exam: Vitals:   12/11/18 1044  BP: 122/72  Pulse: (!) 55  Resp: 18  Temp: 98 F (36.7 C)  TempSrc: Oral  SpO2: 98%  Weight: 154 lb 6.4 oz (70 kg)  Height: 5\' 11"  (1.803 m)   Body mass index is 21.53 kg/m. Wt Readings from Last 3 Encounters:  12/11/18 154 lb 6.4 oz (70 kg)  12/11/18 154 lb 6.4 oz (70 kg)  12/03/18 154 lb 6.4 oz (70 kg)    Physical Exam Constitutional:      General: He is not in acute distress.    Appearance: He is well-developed. He is not diaphoretic.  HENT:     Head: Normocephalic and atraumatic.     Right Ear: Tympanic membrane and  external ear normal.     Left Ear: Tympanic membrane, ear canal and external ear normal.     Ears:     Comments: Small flesh colored mass to canal on right ear.     Mouth/Throat:     Pharynx: No oropharyngeal exudate.  Eyes:     Conjunctiva/sclera: Conjunctivae normal.     Pupils: Pupils are equal, round, and reactive to light.  Neck:     Musculoskeletal: Normal range of motion and neck supple.  Cardiovascular:     Rate and Rhythm: Normal rate and regular rhythm.     Heart sounds: Normal heart sounds.  Pulmonary:     Effort: Pulmonary effort is normal.     Breath sounds: Normal breath sounds.  Abdominal:     General: Bowel sounds are normal.     Palpations: Abdomen is soft.  Musculoskeletal:        General: No tenderness.  Skin:    General: Skin is warm and dry.  Neurological:     Mental Status: He is alert and oriented to person, place, and time.  Labs reviewed: Basic Metabolic Panel: Recent Labs    06/22/18 1230 12/04/18 0804  NA 139 142  K 4.3 4.6  CL 102 103  CO2 32 32  GLUCOSE 86 78  BUN 22 22  CREATININE 0.97 0.93  CALCIUM 9.8 9.7  TSH 2.34 3.15   Liver Function Tests: Recent Labs    12/04/18 0804  AST 34  ALT 28  BILITOT 0.8  PROT 6.8   No results for input(s): LIPASE, AMYLASE in the last 8760 hours. No results for input(s): AMMONIA in the last 8760 hours. CBC: Recent Labs    12/29/17 1425 06/22/18 1230 12/04/18 0804  WBC 10.7* 7.5 7.6  NEUTROABS  --  4.7 4,134  HGB 15.5* 14.6 15.3  HCT 45.9* 42.7 45.0  MCV 89.1 88.2 88.1  PLT  --  190.0 239   Lipid Panel: Recent Labs    12/04/18 0804  CHOL 197  HDL 86  LDLCALC 97  TRIG 55  CHOLHDL 2.3   No results found for: HGBA1C  Procedures: No results found.  Assessment/Plan 1. Wellness examination Pt doing well. Has recently gained back weight he lost, hopes to not keep gaining.  -attempting to increase stamina and exercise.  -The patient was counseled regarding the appropriate  use of alcohol, prevention of dental and periodontal disease, diet, regular sustained exercise for at least 30 minutes 5 times per week, testicular self-examination on a monthly basis,smoking cessation, tobacco use,  and recommended schedule for GI hemoccult testing, colonoscopy, cholesterol, thyroid and diabetes screening. -up to date on PSA screening. -completed Awv today -agreeable to colo-guard -recent follow up with dentist -routine follow up with dermatologist.  - DNR (Do Not Resuscitate) signed and placed on file.   2. Mass of right ear canal Small skin colored lesion to mid right ear canal, he is unsure if anyone has mentioned this before. Pt anxious over skin due to hx of melanoma, will refer to ENT for further evaluation at his time - Ambulatory referral to ENT   Next appt: 6 months for routine followup Hayze Gazda K. [place], [facility] [phone]

## 2018-12-11 NOTE — Patient Instructions (Signed)
Jason Blevins , Thank you for taking time to come for your Medicare Wellness Visit. I appreciate your ongoing commitment to your health goals. Please review the following plan we discussed and let me know if I can assist you in the future.   Screening recommendations/referrals: Colonoscopy -cologuard paper work done today Recommended yearly ophthalmology/optometry visit for glaucoma screening and checkup Recommended yearly dental visit for hygiene and checkup  Vaccinations: Influenza vaccine up to date Pneumococcal vaccine up to date Tdap vaccine up to date Shingles vaccine- to request at local pharmacy  Advanced directives: to bring copy into office so we can place on file.   Conditions/risks identified: to increase physical activity to 30 mins/ 5 days a week.   Next appointment: 1 year  Preventive Care 62 Years and Older, Male Preventive care refers to lifestyle choices and visits with your health care provider that can promote health and wellness. What does preventive care include?  A yearly physical exam. This is also called an annual well check.  Dental exams once or twice a year.  Routine eye exams. Ask your health care provider how often you should have your eyes checked.  Personal lifestyle choices, including:  Daily care of your teeth and gums.  Regular physical activity.  Eating a healthy diet.  Avoiding tobacco and drug use.  Limiting alcohol use.  Practicing safe sex.  Taking low doses of aspirin every day.  Taking vitamin and mineral supplements as recommended by your health care provider. What happens during an annual well check? The services and screenings done by your health care provider during your annual well check will depend on your age, overall health, lifestyle risk factors, and family history of disease. Counseling  Your health care provider may ask you questions about your:  Alcohol use.  Tobacco use.  Drug use.  Emotional well-being.   Home and relationship well-being.  Sexual activity.  Eating habits.  History of falls.  Memory and ability to understand (cognition).  Work and work Statistician. Screening  You may have the following tests or measurements:  Height, weight, and BMI.  Blood pressure.  Lipid and cholesterol levels. These may be checked every 5 years, or more frequently if you are over 13 years old.  Skin check.  Lung cancer screening. You may have this screening every year starting at age 42 if you have a 30-pack-year history of smoking and currently smoke or have quit within the past 15 years.  Fecal occult blood test (FOBT) of the stool. You may have this test every year starting at age 73.  Flexible sigmoidoscopy or colonoscopy. You may have a sigmoidoscopy every 5 years or a colonoscopy every 10 years starting at age 61.  Prostate cancer screening. Recommendations will vary depending on your family history and other risks.  Hepatitis C blood test.  Hepatitis B blood test.  Sexually transmitted disease (STD) testing.  Diabetes screening. This is done by checking your blood sugar (glucose) after you have not eaten for a while (fasting). You may have this done every 1-3 years.  Abdominal aortic aneurysm (AAA) screening. You may need this if you are a current or former smoker.  Osteoporosis. You may be screened starting at age 31 if you are at high risk. Talk with your health care provider about your test results, treatment options, and if necessary, the need for more tests. Vaccines  Your health care provider may recommend certain vaccines, such as:  Influenza vaccine. This is recommended every year.  Tetanus, diphtheria, and acellular pertussis (Tdap, Td) vaccine. You may need a Td booster every 10 years.  Zoster vaccine. You may need this after age 53.  Pneumococcal 13-valent conjugate (PCV13) vaccine. One dose is recommended after age 47.  Pneumococcal polysaccharide (PPSV23)  vaccine. One dose is recommended after age 77. Talk to your health care provider about which screenings and vaccines you need and how often you need them. This information is not intended to replace advice given to you by your health care provider. Make sure you discuss any questions you have with your health care provider. Document Released: 02/17/2015 Document Revised: 10/11/2015 Document Reviewed: 11/22/2014 Elsevier Interactive Patient Education  2017 [place] Prevention in the Home Falls can cause injuries. They can happen to people of all ages. There are many things you can do to make your home safe and to help prevent falls. What can I do on the outside of my home?  Regularly fix the edges of walkways and driveways and fix any cracks.  Remove anything that might make you trip as you walk through a door, such as a raised step or threshold.  Trim any bushes or trees on the path to your home.  Use bright outdoor lighting.  Clear any walking paths of anything that might make someone trip, such as rocks or tools.  Regularly check to see if handrails are loose or broken. Make sure that both sides of any steps have handrails.  Any raised decks and porches should have guardrails on the edges.  Have any leaves, snow, or ice cleared regularly.  Use sand or salt on walking paths during winter.  Clean up any spills in your garage right away. This includes oil or grease spills. What can I do in the bathroom?  Use night lights.  Install grab bars by the toilet and in the tub and shower. Do not use towel bars as grab bars.  Use non-skid mats or decals in the tub or shower.  If you need to sit down in the shower, use a plastic, non-slip stool.  Keep the floor dry. Clean up any water that spills on the floor as soon as it happens.  Remove soap buildup in the tub or shower regularly.  Attach bath mats securely with double-sided non-slip rug tape.  Do not have throw rugs  and other things on the floor that can make you trip. What can I do in the bedroom?  Use night lights.  Make sure that you have a light by your bed that is easy to reach.  Do not use any sheets or blankets that are too big for your bed. They should not hang down onto the floor.  Have a firm chair that has side arms. You can use this for support while you get dressed.  Do not have throw rugs and other things on the floor that can make you trip. What can I do in the kitchen?  Clean up any spills right away.  Avoid walking on wet floors.  Keep items that you use a lot in easy-to-reach places.  If you need to reach something above you, use a strong step stool that has a grab bar.  Keep electrical cords out of the way.  Do not use floor polish or wax that makes floors slippery. If you must use wax, use non-skid floor wax.  Do not have throw rugs and other things on the floor that can make you trip. What can I do  with my stairs?  Do not leave any items on the stairs.  Make sure that there are handrails on both sides of the stairs and use them. Fix handrails that are broken or loose. Make sure that handrails are as long as the stairways.  Check any carpeting to make sure that it is firmly attached to the stairs. Fix any carpet that is loose or worn.  Avoid having throw rugs at the top or bottom of the stairs. If you do have throw rugs, attach them to the floor with carpet tape.  Make sure that you have a light switch at the top of the stairs and the bottom of the stairs. If you do not have them, ask someone to add them for you. What else can I do to help prevent falls?  Wear shoes that:  Do not have high heels.  Have rubber bottoms.  Are comfortable and fit you well.  Are closed at the toe. Do not wear sandals.  If you use a stepladder:  Make sure that it is fully opened. Do not climb a closed stepladder.  Make sure that both sides of the stepladder are locked into  place.  Ask someone to hold it for you, if possible.  Clearly mark and make sure that you can see:  Any grab bars or handrails.  First and last steps.  Where the edge of each step is.  Use tools that help you move around (mobility aids) if they are needed. These include:  Canes.  Walkers.  Scooters.  Crutches.  Turn on the lights when you go into a dark area. Replace any light bulbs as soon as they burn out.  Set up your furniture so you have a clear path. Avoid moving your furniture around.  If any of your floors are uneven, fix them.  If there are any pets around you, be aware of where they are.  Review your medicines with your doctor. Some medicines can make you feel dizzy. This can increase your chance of falling. Ask your doctor what other things that you can do to help prevent falls. This information is not intended to replace advice given to you by your health care provider. Make sure you discuss any questions you have with your health care provider. Document Released: 11/17/2008 Document Revised: 06/29/2015 Document Reviewed: 02/25/2014 Elsevier Interactive Patient Education  2017 [employer].

## 2018-12-11 NOTE — Patient Instructions (Addendum)
Call us and let us know who your dermatologist is so we can add it to your team    Health Maintenance, Male Adopting a healthy lifestyle and getting preventive care are important in promoting health and wellness. Ask your health care provider about:  The right schedule for you to have regular tests and exams.  Things you can do on your own to prevent diseases and keep yourself healthy. What should I know about diet, weight, and exercise? Eat a healthy diet   Eat a diet that includes plenty of vegetables, fruits, low-fat dairy products, and lean protein.  Do not eat a lot of foods that are high in solid fats, added sugars, or sodium. Maintain a healthy weight Body mass index (BMI) is a measurement that can be used to identify possible weight problems. It estimates body fat based on height and weight. Your health care provider can help determine your BMI and help you achieve or maintain a healthy weight. Get regular exercise Get regular exercise. This is one of the most important things you can do for your health. Most adults should:  Exercise for at least 150 minutes each week. The exercise should increase your heart rate and make you sweat (moderate-intensity exercise).  Do strengthening exercises at least twice a week. This is in addition to the moderate-intensity exercise.  Spend less time sitting. Even light physical activity can be beneficial. Watch cholesterol and blood lipids Have your blood tested for lipids and cholesterol at 70 years of age, then have this test every 5 years. You may need to have your cholesterol levels checked more often if:  Your lipid or cholesterol levels are high.  You are older than 70 years of age.  You are at high risk for heart disease. What should I know about cancer screening? Many types of cancers can be detected early and may often be prevented. Depending on your health history and family history, you may need to have cancer screening at  various ages. This may include screening for:  Colorectal cancer.  Prostate cancer.  Skin cancer.  Lung cancer. What should I know about heart disease, diabetes, and high blood pressure? Blood pressure and heart disease  High blood pressure causes heart disease and increases the risk of stroke. This is more likely to develop in people who have high blood pressure readings, are of African descent, or are overweight.  Talk with your health care provider about your target blood pressure readings.  Have your blood pressure checked: ? Every 3-5 years if you are 39-64 years of age. ? Every year if you are 64 years old or older.  If you are between the ages of 48 and 83 and are a current or former smoker, ask your health care provider if you should have a one-time screening for abdominal aortic aneurysm (AAA). Diabetes Have regular diabetes screenings. This checks your fasting blood sugar level. Have the screening done:  Once every three years after age 10 if you are at a normal weight and have a low risk for diabetes.  More often and at a younger age if you are overweight or have a high risk for diabetes. What should I know about preventing infection? Hepatitis B If you have a higher risk for hepatitis B, you should be screened for this virus. Talk with your health care provider to find out if you are at risk for hepatitis B infection. Hepatitis C Blood testing is recommended for:  Everyone born from [birth year] through  [place] with known risk factors for hepatitis C. Sexually transmitted infections (STIs)  You should be screened each year for STIs, including gonorrhea and chlamydia, if: ? You are sexually active and are younger than 70 years of age. ? You are older than 70 years of age and your health care provider tells you that you are at risk for this type of infection. ? Your sexual activity has changed since you were last screened, and you are at increased risk for chlamydia  or gonorrhea. Ask your health care provider if you are at risk.  Ask your health care provider about whether you are at high risk for HIV. Your health care provider may recommend a prescription medicine to help prevent HIV infection. If you choose to take medicine to prevent HIV, you should first get tested for HIV. You should then be tested every 3 months for as long as you are taking the medicine. Follow these instructions at home: Lifestyle  Do not use any products that contain nicotine or tobacco, such as cigarettes, e-cigarettes, and chewing tobacco. If you need help quitting, ask your health care provider.  Do not use street drugs.  Do not share needles.  Ask your health care provider for help if you need support or information about quitting drugs. Alcohol use  Do not drink alcohol if your health care provider tells you not to drink.  If you drink alcohol: ? Limit how much you have to 0-2 drinks a day. ? Be aware of how much alcohol is in your drink. In the [place] one drink equals one 12 oz bottle of beer (355 mL), one 5 oz glass of wine (148 mL), or one 1 oz glass of hard liquor (44 mL). General instructions  Schedule regular health, dental, and eye exams.  Stay current with your vaccines.  Tell your health care provider if: ? You often feel depressed. ? You have ever been abused or do not feel safe at home. Summary  Adopting a healthy lifestyle and getting preventive care are important in promoting health and wellness.  Follow your health care provider's instructions about healthy diet, exercising, and getting tested or screened for diseases.  Follow your health care provider's instructions on monitoring your cholesterol and blood pressure. This information is not intended to replace advice given to you by your health care provider. Make sure you discuss any questions you have with your health care provider. Document Released: 07/20/2007 Document Revised: 01/14/2018  Document Reviewed: 01/14/2018 Elsevier Patient Education  2020 [employer].

## 2018-12-18 ENCOUNTER — Encounter: Payer: PPO | Admitting: Gastroenterology

## 2018-12-22 DIAGNOSIS — Z1212 Encounter for screening for malignant neoplasm of rectum: Secondary | ICD-10-CM | POA: Diagnosis not present

## 2018-12-22 DIAGNOSIS — Z1211 Encounter for screening for malignant neoplasm of colon: Secondary | ICD-10-CM | POA: Diagnosis not present

## 2018-12-22 LAB — COLOGUARD: Cologuard: POSITIVE — AB

## 2018-12-23 ENCOUNTER — Other Ambulatory Visit: Payer: Self-pay

## 2018-12-23 DIAGNOSIS — Z20822 Contact with and (suspected) exposure to covid-19: Secondary | ICD-10-CM

## 2018-12-25 LAB — NOVEL CORONAVIRUS, NAA: SARS-CoV-2, NAA: NOT DETECTED

## 2018-12-29 ENCOUNTER — Encounter: Payer: Self-pay | Admitting: *Deleted

## 2018-12-30 ENCOUNTER — Other Ambulatory Visit: Payer: Self-pay | Admitting: Nurse Practitioner

## 2018-12-30 DIAGNOSIS — R195 Other fecal abnormalities: Secondary | ICD-10-CM

## 2019-01-07 ENCOUNTER — Ambulatory Visit (INDEPENDENT_AMBULATORY_CARE_PROVIDER_SITE_OTHER): Payer: PPO | Admitting: Otolaryngology

## 2019-01-07 ENCOUNTER — Encounter (INDEPENDENT_AMBULATORY_CARE_PROVIDER_SITE_OTHER): Payer: Self-pay | Admitting: Otolaryngology

## 2019-01-07 ENCOUNTER — Other Ambulatory Visit: Payer: Self-pay

## 2019-01-07 VITALS — Temp 97.2°F

## 2019-01-07 DIAGNOSIS — D164 Benign neoplasm of bones of skull and face: Secondary | ICD-10-CM | POA: Diagnosis not present

## 2019-01-07 NOTE — Progress Notes (Signed)
HPI: Jason Blevins is a 68 y.o. male who presents for evaluation of ear finding.  Apparently at the extended care facility he is staying at someone looked in his ears and noticed a white bump in the ear canal and recommended having this checked.  He is not sure which ear canal the white bump was noted.  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  He is denying any ear pain or discomfort.  He has hearing aids but does not wear them  Past Medical History:  Diagnosis Date  . Allergy   . Anxiety   . Asthma    AS CHILD  . Blood transfusion without reported diagnosis   . Cataract    BEGINNING  . Skin cancer    Per [facility] New Patient Packet   Past Surgical History:  Procedure Laterality Date  . MOHS SURGERY  1994   Per [facility] New Patient Packet  . SPINE SURGERY     Social History   Socioeconomic History  . Marital status: Married    Spouse name: Not on file  . Number of children: Not on file  . Years of education: Not on file  . Highest education level: Not on file  Occupational History  . Not on file  Social Needs  . Financial resource strain: Not on file  . Food insecurity    Worry: Not on file    Inability: Not on file  . Transportation needs    Medical: Not on file    Non-medical: Not on file  Tobacco Use  . Smoking status: Never Smoker  . Smokeless tobacco: Never Used  Substance and Sexual Activity  . Alcohol use: Yes    Comment: 2-3 nightly   . Drug use: No  . Sexual activity: Yes  Lifestyle  . Physical activity    Days per week: Not on file    Minutes per session: Not on file  . Stress: Not on file  Relationships  . Social Herbalist on phone: Not on file    Gets together: Not on file    Attends religious service: Not on file    Active member of club or organization: Not on file    Attends meetings of clubs or organizations: Not on file    Relationship status: Not on file  Other Topics Concern  . Not on file  Social History Narrative   Per [facility] New Patient Packet abstracted 11/04/2018      Diet: N/A      Caffeine: Yes      Married, if yes what year: Separated, married in [place] you live in a house, apartment, assisted living, condo, trailer, ect: House, one stories, one person       Pets: Dog, bassett hound       Current/Past profession: Conservator, museum/gallery, educator       Exercise: walking/workout whe possible          Living Will: yes   DNR: yes   POA/HPOA: yes      Functional Status:   Do you have difficulty bathing or dressing yourself? No   Do you have difficulty preparing food or eating? No   Do you have difficulty managing your medications? No   Do you have difficulty managing your finances? No  Do you have difficulty affording your medications? No   Family History  Problem Relation Age of Onset  . Cancer Mother        unsure what type   . Arthritis Sister   . Cancer Sister 60       skin  . Heart disease Maternal Grandfather   . Colon cancer Neg Hx   . Colon polyps Neg Hx   . Esophageal cancer Neg Hx   . Rectal cancer Neg Hx   . Stomach cancer Neg Hx    No Known Allergies Prior to Admission medications   Medication Sig Start Date End Date Taking? Authorizing Provider  Ascorbic Acid (VITAMIN C WITH ROSE HIPS) 500 MG tablet 1000/500 mg 1 by mouth twice daily   Yes [provider]  BETAINE PO Take 521 mg by mouth 2 (two) times daily.   Yes [provider]  Cholecalciferol 25 MCG (1000 UT) capsule Take 2,000 Units by mouth daily.   Yes [provider]  DHEA 25 MG CAPS Take 50 mg by mouth daily.   Yes [provider]  HYDROcodone-acetaminophen (NORCO/VICODIN) 5-325 MG tablet Take 1 tablet by mouth every 6 (six) hours as needed for moderate pain. 07/05/18  Yes Sagardia, Ines Bloomer, MD  IRON PO 1,100 mg. 2 by mouth in the am and 1 by mouth in the pm   Yes  [provider]  Magnesium 250 MG TABS Take 1 tablet by mouth daily.   Yes [provider]  MELATONIN PO Take by mouth. 1-3 mg as needed   Yes [provider]  Multiple Vitamins-Minerals (ZINC PO) Take 30 mg by mouth 2 (two) times daily.   Yes [provider]  Turmeric 500 MG CAPS Take 1 capsule by mouth daily.   Yes [provider]  UNABLE TO FIND Med Name: [place] choice immune, 2 by mouth in the am, 2 by mouth in the pm   Yes [provider]  [place] Name: Dicalcium Malate 150/250 mg, 1 by mouth twice daily   Yes [provider]  [place] Name: Iodoral 12.5 mg 1 by mouth daily   Yes [provider]  [place] Name: Itis Care 1 gram 1 by mouth twice daily   Yes [provider]  [place] Name: Nutrient 950, 3 by mouth in the am, 2 by mouth in the pm   Yes [provider]  [place] Name: Pyridoxal 5-Phosphate 50 mg, 2 by mouth in the pm   Yes [provider]  [place] Name: YES EFA's 740 mg 1 by mouth twice daily   Yes [provider]  vitamin A 10000 UNIT capsule Take 10,000 Units by mouth daily.   Yes [provider]     Positive ROS: Otherwise negative  All other systems have been reviewed and were otherwise negative with the exception of those mentioned in the HPI and as above.  Physical Exam: Constitutional: Alert, well-appearing, no acute distress Ears: External ears without lesions or tenderness. Ear canals are clear bilaterally with intact, clear TMs.  Of note patient has a small osteoma within the right ear canal.  TMs are clear and otherwise ear canal is normal.  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 Nasal: External nose without lesions. Septum relatively midline. Clear nasal passages Oral: Lips and gums without lesions. Tongue and palate mucosa without lesions. Posterior oropharynx  clear. Neck: No  palpable adenopathy or masses Respiratory: Breathing comfortably  Skin: No facial/neck lesions or rash noted.  Procedures  Assessment: Osteoma of the right ear canal  Plan: No further therapy is needed.  Radene Journey, MD

## 2019-01-19 ENCOUNTER — Telehealth: Payer: Self-pay | Admitting: *Deleted

## 2019-01-19 DIAGNOSIS — Z1159 Encounter for screening for other viral diseases: Secondary | ICD-10-CM

## 2019-01-19 NOTE — Telephone Encounter (Signed)
Called and rescheduled covid test for 01/20/19 @ 8:35 pm.

## 2019-01-20 ENCOUNTER — Ambulatory Visit (INDEPENDENT_AMBULATORY_CARE_PROVIDER_SITE_OTHER): Payer: PPO

## 2019-01-20 DIAGNOSIS — Z1159 Encounter for screening for other viral diseases: Secondary | ICD-10-CM

## 2019-01-21 ENCOUNTER — Other Ambulatory Visit: Payer: Self-pay

## 2019-01-21 ENCOUNTER — Encounter: Payer: Self-pay | Admitting: Gastroenterology

## 2019-01-21 ENCOUNTER — Ambulatory Visit (AMBULATORY_SURGERY_CENTER): Payer: PPO | Admitting: Gastroenterology

## 2019-01-21 VITALS — BP 108/62 | HR 51 | Temp 98.3°F | Resp 9 | Ht 71.0 in | Wt 154.4 lb

## 2019-01-21 DIAGNOSIS — Z538 Procedure and treatment not carried out for other reasons: Secondary | ICD-10-CM

## 2019-01-21 DIAGNOSIS — Z1211 Encounter for screening for malignant neoplasm of colon: Secondary | ICD-10-CM

## 2019-01-21 DIAGNOSIS — R195 Other fecal abnormalities: Secondary | ICD-10-CM

## 2019-01-21 LAB — SARS CORONAVIRUS 2 (TAT 6-24 HRS): SARS Coronavirus 2: NEGATIVE

## 2019-01-21 MED ORDER — SODIUM CHLORIDE 0.9 % IV SOLN: 500.0000 mL | Freq: Once | INTRAVENOUS | Status: DC

## 2019-01-21 NOTE — Progress Notes (Signed)
To PACU, VSS. Report to [email or web address] 

## 2019-01-21 NOTE — Progress Notes (Signed)
Pt instructed with new prep instructions- Plenvu sample given.

## 2019-01-21 NOTE — Progress Notes (Signed)
Temp   VS  CW  Pt's states no medical or surgical changes since previsit or office visit.   

## 2019-01-21 NOTE — Op Note (Signed)
[place] Patient Name: Jason Blevins Procedure Date: 01/21/2019 1:32 PM MRN: [id number] Endoscopist: Thornton Park MD, MD Age: 68 Referring MD:  Date of Birth: 05-19-1950 Gender: Male Account #: [id number] Procedure:                Colonoscopy Indications:              Positive Cologuard test                           No known family history of colon cancer or polyps. Medicines:                Monitored Anesthesia Care Procedure:                Pre-Anesthesia Assessment:                           - Prior to the procedure, a History and Physical                            was performed, and patient medications and                            allergies were reviewed. The patient's tolerance of                            previous anesthesia was also reviewed. The risks                            and benefits of the procedure and the sedation                            options and risks were discussed with the patient.                            All questions were answered, and informed consent                            was obtained. Prior Anticoagulants: The patient has                            taken no previous anticoagulant or antiplatelet                            agents. ASA Grade Assessment: II - A patient with                            mild systemic disease. After reviewing the risks                            and benefits, the patient was deemed in                            satisfactory condition to undergo the procedure.  After obtaining informed consent, the colonoscope                            was passed under direct vision. Throughout the                            procedure, the patient's blood pressure, pulse, and                            oxygen saturations were monitored continuously. The                            Colonoscope was introduced through the anus and                            advanced to the the sigmoid colon.  The colonoscopy                            was technically difficult and complex due to                            inadequate bowel prep. The patient tolerated the                            procedure well. The quality of the bowel                            preparation was inadequate. Scope In: 1:33:11 PM Scope Out: 1:35:59 PM Total Procedure Duration: 0 hours 2 minutes 48 seconds  Findings:                 The perianal and digital rectal examinations were                            normal.                           A large amount of semi-solid solid stool was found                            in the rectum, in the recto-sigmoid colon and in                            the sigmoid colon, precluding visualization. I was                            unable to evaluate for large or small lesions. Complications:            No immediate complications. Estimated Blood Loss:     Estimated blood loss: none. Impression:               - Preparation of the colon was inadequate.                           - Stool in the rectum, in the  recto-sigmoid colon                            and in the sigmoid colon.                           - No specimens collected. Recommendation:           - Patient has a contact number available for                            emergencies. The signs and symptoms of potential                            delayed complications were discussed with the                            patient. Return to normal activities tomorrow.                            Written discharge instructions were provided to the                            patient.                           - Ideally, Jason Blevins could return for a                            colonoscopy tomorrow morning. If that is possible,                            he should continue a clear liquid diet today.                            Follow the instructions for another bowel prep                            tonight. If unable to return  tomorrow, the                            colonoscopy should be rescheduled using a 2 day                            prep.                           - Continue present medications. Thornton Park MD, MD 01/21/2019 1:43:41 PM This report has been signed electronically.

## 2019-01-21 NOTE — Patient Instructions (Signed)
Stay on clear liquids today.  Follow instructions for another bowel prep  Tonight.  Colonoscopy is scheduled for 7:30am tomorrow, December 18.  YOU HAD AN ENDOSCOPIC PROCEDURE TODAY AT [facility]:   Refer to the procedure report that was given to you for any specific questions about what was found during the examination.  If the procedure report does not answer your questions, please call your gastroenterologist to clarify.  If you requested that your care partner not be given the details of your procedure findings, then the procedure report has been included in a sealed envelope for you to review at your convenience later.  YOU SHOULD EXPECT: Some feelings of bloating in the abdomen. Passage of more gas than usual.  Walking can help get rid of the air that was put into your GI tract during the procedure and reduce the bloating. If you had a lower endoscopy (such as a colonoscopy or flexible sigmoidoscopy) you may notice spotting of blood in your stool or on the toilet paper. If you underwent a bowel prep for your procedure, you may not have a normal bowel movement for a few days.  Please Note:  You might notice some irritation and congestion in your nose or some drainage.  This is from the oxygen used during your procedure.  There is no need for concern and it should clear up in a day or so.  SYMPTOMS TO REPORT IMMEDIATELY:   Following lower endoscopy (colonoscopy or flexible sigmoidoscopy):  Excessive amounts of blood in the stool  Significant tenderness or worsening of abdominal pains  Swelling of the abdomen that is new, acute  Fever of 100F or higher  For urgent or emergent issues, a gastroenterologist can be reached at any hour by calling [phone].   DIET:  We do recommend a small meal at first, but then you may proceed to your regular diet.  Drink plenty of fluids but you should avoid alcoholic beverages for 24 hours.  ACTIVITY:  You should plan to take it easy  for the rest of today and you should NOT DRIVE or use heavy machinery until tomorrow (because of the sedation medicines used during the test).    FOLLOW UP: Our staff will call the number listed on your records 48-72 hours following your procedure to check on you and address any questions or concerns that you may have regarding the information given to you following your procedure. If we do not reach you, we will leave a message.  We will attempt to reach you two times.  During this call, we will ask if you have developed any symptoms of COVID 19. If you develop any symptoms (ie: fever, flu-like symptoms, shortness of breath, cough etc.) before then, please call [phone].  If you test positive for Covid 19 in the 2 weeks post procedure, please call and report this information to [place].    If any biopsies were taken you will be contacted by phone or by letter within the next 1-3 weeks.  Please call us at [phone] if you have not heard about the biopsies in 3 weeks.    SIGNATURES/CONFIDENTIALITY: You and/or your care partner have signed paperwork which will be entered into your electronic medical record.  These signatures attest to the fact that that the information above on your After Visit Summary has been reviewed and is understood.  Full responsibility of the confidentiality of this discharge information lies with you and/or your care-partner.

## 2019-01-22 ENCOUNTER — Encounter: Payer: Self-pay | Admitting: Gastroenterology

## 2019-01-22 ENCOUNTER — Ambulatory Visit (AMBULATORY_SURGERY_CENTER): Payer: PPO | Admitting: Gastroenterology

## 2019-01-22 VITALS — BP 103/62 | HR 60 | Temp 97.8°F | Resp 16 | Ht 71.0 in | Wt 154.0 lb

## 2019-01-22 DIAGNOSIS — R195 Other fecal abnormalities: Secondary | ICD-10-CM

## 2019-01-22 DIAGNOSIS — K648 Other hemorrhoids: Secondary | ICD-10-CM | POA: Diagnosis not present

## 2019-01-22 DIAGNOSIS — D127 Benign neoplasm of rectosigmoid junction: Secondary | ICD-10-CM | POA: Diagnosis not present

## 2019-01-22 DIAGNOSIS — K529 Noninfective gastroenteritis and colitis, unspecified: Secondary | ICD-10-CM | POA: Diagnosis not present

## 2019-01-22 DIAGNOSIS — K635 Polyp of colon: Secondary | ICD-10-CM | POA: Diagnosis not present

## 2019-01-22 DIAGNOSIS — K621 Rectal polyp: Secondary | ICD-10-CM

## 2019-01-22 DIAGNOSIS — D125 Benign neoplasm of sigmoid colon: Secondary | ICD-10-CM

## 2019-01-22 DIAGNOSIS — Z1211 Encounter for screening for malignant neoplasm of colon: Secondary | ICD-10-CM

## 2019-01-22 DIAGNOSIS — D128 Benign neoplasm of rectum: Secondary | ICD-10-CM

## 2019-01-22 DIAGNOSIS — K5289 Other specified noninfective gastroenteritis and colitis: Secondary | ICD-10-CM

## 2019-01-22 DIAGNOSIS — K633 Ulcer of intestine: Secondary | ICD-10-CM

## 2019-01-22 DIAGNOSIS — K5730 Diverticulosis of large intestine without perforation or abscess without bleeding: Secondary | ICD-10-CM | POA: Diagnosis not present

## 2019-01-22 MED ORDER — SODIUM CHLORIDE 0.9 % IV SOLN: 500.0000 mL | INTRAVENOUS | Status: DC

## 2019-01-22 NOTE — Progress Notes (Signed)
Called to room to assist during endoscopic procedure.  Patient ID and intended procedure confirmed with present staff. Received instructions for my participation in the procedure from the performing physician.  

## 2019-01-22 NOTE — Op Note (Addendum)
[place] Patient Name: Jason Blevins Procedure Date: 01/22/2019 7:06 AM MRN: [id number] Endoscopist: Thornton Park MD, MD Age: 68 Referring MD:  Date of Birth: [birth year] Gender: Male Account #: [id number] Procedure:                Colonoscopy Indications:              Positive Cologuard test                           No known family history of colon cancer or polyps. Medicines:                Monitored Anesthesia Care Procedure:                Pre-Anesthesia Assessment:                           - Prior to the procedure, a History and Physical                            was performed, and patient medications and                            allergies were reviewed. The patient's tolerance of                            previous anesthesia was also reviewed. The risks                            and benefits of the procedure and the sedation                            options and risks were discussed with the patient.                            All questions were answered, and informed consent                            was obtained. Prior Anticoagulants: The patient has                            taken no previous anticoagulant or antiplatelet                            agents. ASA Grade Assessment: II - A patient with                            mild systemic disease. After reviewing the risks                            and benefits, the patient was deemed in                            satisfactory condition to undergo the procedure.  After obtaining informed consent, the colonoscope                            was passed under direct vision. Throughout the                            procedure, the patient's blood pressure, pulse, and                            oxygen saturations were monitored continuously. The                            Colonoscope was introduced through the anus and                            advanced to the 8 cm into the ileum.  A second                            forward view of the right colon was performed. The                            colonoscopy was performed without difficulty. The                            patient tolerated the procedure well. The quality                            of the bowel preparation was good. The terminal                            ileum, ileocecal valve, appendiceal orifice, and                            rectum were photographed. Scope In: 8:29:11 AM Scope Out: [id number] AM Scope Withdrawal Time: 0 hours 12 minutes 41 seconds  Total Procedure Duration: 0 hours 17 minutes 2 seconds  Findings:                 The perianal and digital rectal examinations were                            normal.                           Multiple small and large-mouthed diverticula were                            found in the sigmoid colon and descending colon.                           Two sessile polyps were found in the rectum and                            sigmoid colon. The polyps were 2 to 3 mm in size.  These polyps were removed with a cold snare.                            Resection and retrieval were complete. Estimated                            blood loss was minimal.                           Patchy areas of mildly erythematous and granular                            mucosa was found in the descending colon, in the                            ascending colon and in the cecum. Biopsies were                            taken from the right colon, transverse colon, left                            colon, and rectum with a cold forceps for                            histology. Estimated blood loss was minimal.                           The terminal ileum appeared normal. Biopsies were                            taken with a cold forceps for histology. Estimated                            blood loss was minimal.                           Non-bleeding internal hemorrhoids  were found during                            retroflexion.                           The exam was otherwise without abnormality on                            direct and retroflexion views. Complications:            No immediate complications. Estimated blood loss:                            Minimal. Estimated Blood Loss:     Estimated blood loss was minimal. Impression:               - Diverticulosis in the sigmoid colon and in the  descending colon.                           - Two 2 to 3 mm polyps in the rectum and in the                            sigmoid colon, removed with a cold snare. Resected                            and retrieved.                           - Erythematous and granular mucosa in the                            descending colon, in the ascending colon and in the                            cecum. This is of unclear clinical significant. It                            could be related to non-steroidal anti-inflammatory                            medications, the purgative consumed prior to the                            colonoscopy, and even chronic inflammation.                            Biopsied.                           - The examined portion of the ileum was normal.                            Biopsied.                           - Non-bleeding internal hemorrhoids.                           - The examination was otherwise normal on direct                            and retroflexion views. Recommendation:           - Patient has a contact number available for                            emergencies. The signs and symptoms of potential                            delayed complications were discussed with the  patient. Return to normal activities tomorrow.                            Written discharge instructions were provided to the                            patient.                           - High fiber diet.                            - Continue present medications.                           - Await pathology results.                           - Repeat colonoscopy date to be determined after                            pending pathology results are reviewed for                            surveillance. Thornton Park MD, MD 01/22/2019 8:54:14 AM This report has been signed electronically.

## 2019-01-22 NOTE — Progress Notes (Signed)
PT taken to PACU. Monitors in place. VSS. Report given to RN. 

## 2019-01-22 NOTE — Patient Instructions (Signed)
Impression/Recommendations:  Diverticulosis handout given to patient. Polyp handout given to patient. Hemorrhoid handout given to patient. High fiber diet handout given to patient.  High fiber diet. Continue present medications. Await pathology results.  Repeat colonoscopy for surveillance.  Date to be determined after pathology results reviewed.  YOU HAD AN ENDOSCOPIC PROCEDURE TODAY AT [facility]:   Refer to the procedure report that was given to you for any specific questions about what was found during the examination.  If the procedure report does not answer your questions, please call your gastroenterologist to clarify.  If you requested that your care partner not be given the details of your procedure findings, then the procedure report has been included in a sealed envelope for you to review at your convenience later.  YOU SHOULD EXPECT: Some feelings of bloating in the abdomen. Passage of more gas than usual.  Walking can help get rid of the air that was put into your GI tract during the procedure and reduce the bloating. If you had a lower endoscopy (such as a colonoscopy or flexible sigmoidoscopy) you may notice spotting of blood in your stool or on the toilet paper. If you underwent a bowel prep for your procedure, you may not have a normal bowel movement for a few days.  Please Note:  You might notice some irritation and congestion in your nose or some drainage.  This is from the oxygen used during your procedure.  There is no need for concern and it should clear up in a day or so.  SYMPTOMS TO REPORT IMMEDIATELY:   Following lower endoscopy (colonoscopy or flexible sigmoidoscopy):  Excessive amounts of blood in the stool  Significant tenderness or worsening of abdominal pains  Swelling of the abdomen that is new, acute  Fever of 100F or higher  For urgent or emergent issues, a gastroenterologist can be reached at any hour by calling [phone]  [phone].   DIET:  We do recommend a small meal at first, but then you may proceed to your regular diet.  Drink plenty of fluids but you should avoid alcoholic beverages for 24 hours.  ACTIVITY:  You should plan to take it easy for the rest of today and you should NOT DRIVE or use heavy machinery until tomorrow (because of the sedation medicines used during the test).    FOLLOW UP: Our staff will call the number listed on your records 48-72 hours following your procedure to check on you and address any questions or concerns that you may have regarding the information given to you following your procedure. If we do not reach you, we will leave a message.  We will attempt to reach you two times.  During this call, we will ask if you have developed any symptoms of COVID 19. If you develop any symptoms (ie: fever, flu-like symptoms, shortness of breath, cough etc.) before then, please call [phone].  If you test positive for Covid 19 in the 2 weeks post procedure, please call and report this information to [place].    If any biopsies were taken you will be contacted by phone or by letter within the next 1-3 weeks.  Please call us at [phone] if you have not heard about the biopsies in 3 weeks.    SIGNATURES/CONFIDENTIALITY: You and/or your care partner have signed paperwork which will be entered into your electronic medical record.  These signatures attest to the fact that that the information above on your After Visit Summary has  been reviewed and is understood.  Full responsibility of the confidentiality of this discharge information lies with you and/or your care-partner.

## 2019-01-22 NOTE — Progress Notes (Signed)
Pt's states no medical or surgical changes since yesterday. IV Santina Evans checked in.

## 2019-01-26 ENCOUNTER — Telehealth: Payer: Self-pay

## 2019-01-26 NOTE — Telephone Encounter (Signed)
  Follow up Call-  Call back number 01/22/2019 01/21/2019  Post procedure Call Back phone  # [phone]  Permission to leave phone message Yes Yes  Some recent data might be hidden     Patient questions:  Do you have a fever, pain , or abdominal swelling? No. Pain Score  0 *  Have you tolerated food without any problems? Yes.    Have you been able to return to your normal activities? Yes.    Do you have any questions about your discharge instructions: Diet   No. Medications  No. Follow up visit  No.  Do you have questions or concerns about your Care? No.  Actions: * If pain score is 4 or above: No action needed, pain <4.  1. Have you developed a fever since your procedure? no  2.   Have you had an respiratory symptoms (SOB or cough) since your procedure? no  3.   Have you tested positive for COVID 19 since your procedure no  4.   Have you had any family members/close contacts diagnosed with the COVID 19 since your procedure?  no   If yes to any of these questions please route to Joylene John, RN and Alphonsa Gin, Therapist, sports.

## 2019-03-16 ENCOUNTER — Ambulatory Visit: Payer: PPO

## 2019-03-18 ENCOUNTER — Ambulatory Visit: Payer: PPO

## 2019-03-18 ENCOUNTER — Ambulatory Visit: Payer: PPO | Attending: Internal Medicine

## 2019-03-18 DIAGNOSIS — Z23 Encounter for immunization: Secondary | ICD-10-CM

## 2019-03-18 NOTE — Progress Notes (Signed)
   Covid-19 Vaccination Clinic  Name:  Jason Blevins    MRN: [id number] DOB: December 28, 1950  03/18/2019  Mr. [name] was observed post Covid-19 immunization for 15 minutes without incidence. He was provided with Vaccine Information Sheet and instruction to access the V-Safe system.   Mr. [name] was instructed to call 911 with any severe reactions post vaccine: Marland Kitchen Difficulty breathing  . Swelling of your face and throat  . A fast heartbeat  . A bad rash all over your body  . Dizziness and weakness    Immunizations Administered    Name Date Dose VIS Date Route   Pfizer COVID-19 Vaccine 03/18/2019  2:02 PM 0.3 mL 01/15/2019 Intramuscular   Manufacturer: [place]   Lot: [id number]   [place]: [id number]

## 2019-03-26 ENCOUNTER — Telehealth: Payer: Self-pay | Admitting: *Deleted

## 2019-03-26 ENCOUNTER — Other Ambulatory Visit: Payer: Self-pay | Admitting: Nurse Practitioner

## 2019-03-26 DIAGNOSIS — M545 Low back pain, unspecified: Secondary | ICD-10-CM

## 2019-03-26 DIAGNOSIS — G8929 Other chronic pain: Secondary | ICD-10-CM

## 2019-03-26 NOTE — Telephone Encounter (Signed)
Patient called and stated that he was upset because his Hydrocodone Rx was Denied. Stated that was the only reason why he switched Dr. And came here and he was upfront at first appointment.Stated that he doesn't use it often but he does need it.  Stated that if we are not going to refill it there is no need to keep coming here. Please Advise.    Reviewed [place] Database LR: 07/05/2018 by Dr. [name]

## 2019-03-26 NOTE — Telephone Encounter (Signed)
Patient notified and agreed. Stated that he was just in the office and should have been told this then. Stated that our medical system is broken and we are failing him. I apologized to patient.  He stated that he will call back to schedule an appointment.

## 2019-03-26 NOTE — Telephone Encounter (Signed)
Due to the fact this is not a routine medication he will need an office visit to discuss the acute need for pain medication- this is the guideline for prescribing this type of medication. He will also need to make sure to have a signed contract on file so we are able to prescribe it.

## 2019-03-31 ENCOUNTER — Other Ambulatory Visit: Payer: Self-pay

## 2019-03-31 ENCOUNTER — Ambulatory Visit (INDEPENDENT_AMBULATORY_CARE_PROVIDER_SITE_OTHER): Payer: PPO | Admitting: Nurse Practitioner

## 2019-03-31 ENCOUNTER — Encounter: Payer: Self-pay | Admitting: Nurse Practitioner

## 2019-03-31 VITALS — BP 118/70 | HR 60 | Temp 97.1°F | Ht 71.0 in | Wt 160.0 lb

## 2019-03-31 DIAGNOSIS — G8929 Other chronic pain: Secondary | ICD-10-CM | POA: Diagnosis not present

## 2019-03-31 DIAGNOSIS — M545 Low back pain: Secondary | ICD-10-CM

## 2019-03-31 NOTE — Progress Notes (Signed)
Careteam: Patient Care Team: Lauree Chandler, NP as PCP - General (Geriatric Medicine) Melissa Noon, [place] as Referring Physician (Optometry) Tanda Rockers, MD as Consulting Physician (Pulmonary Disease)  Advanced Directive information    No Known Allergies  Chief Complaint  Patient presents with  . Medication Management    Medication review      HPI: Patient is a 69 y.o. male for follow up on back pain.  Pt made appt today to get refill for hydrocodone-apap which he has had on his medication list for several years but no recent refill. Reports he has occasional refill when he gets pain. Can not take ibuprofen due to GI issues. Has not had any imagining in epic.  When asked about the pain he simply asked if he was going to get a refill on his medication or not.  "I do not need to stay if I will not get a refill"  Review of Systems:  Review of Systems  Musculoskeletal: Positive for back pain.    Past Medical History:  Diagnosis Date  . Allergy   . Anxiety   . Asthma    AS CHILD  . Blood transfusion without reported diagnosis   . Cataract    BEGINNING  . Skin cancer    Per [facility] New Patient Packet   Past Surgical History:  Procedure Laterality Date  . MOHS SURGERY  1994   Per [facility] New Patient Packet  . SPINE SURGERY     Social History:   reports that he has never smoked. He has never used smokeless tobacco. He reports current alcohol use. He reports that he does not use drugs.  Family History  Problem Relation Age of Onset  . Cancer Mother        unsure what type   . Arthritis Sister   . Cancer Sister 60       skin  . Heart disease Maternal Grandfather   . Colon cancer Neg Hx   . Colon polyps Neg Hx   . Esophageal cancer Neg Hx   . Rectal cancer Neg Hx   . Stomach cancer Neg Hx     Medications: Patient's Medications  New Prescriptions   No medications on file  Previous Medications   ASCORBIC ACID (VITAMIN C WITH ROSE HIPS) 500 MG TABLET     1000/500 mg 1 by mouth twice daily   BETAINE PO    Take 521 mg by mouth 2 (two) times daily.   CHOLECALCIFEROL 25 MCG (1000 UT) CAPSULE    Take 2,000 Units by mouth daily.   DHEA 25 MG CAPS    Take 50 mg by mouth daily.   HYDROCODONE-ACETAMINOPHEN (NORCO/VICODIN) 5-325 MG TABLET    Take 1 tablet by mouth every 6 (six) hours as needed for moderate pain.   IRON PO    1,100 mg. 2 by mouth in the am and 1 by mouth in the pm   MAGNESIUM 250 MG TABS    Take 1 tablet by mouth daily.   MELATONIN PO    Take by mouth. 1-3 mg as needed   MULTIPLE VITAMINS-MINERALS (ZINC PO)    Take 30 mg by mouth 2 (two) times daily.   TURMERIC 500 MG CAPS    Take 1 capsule by mouth daily.   UNABLE TO FIND    Med Name: Russion choice immune, 2 by mouth in the am, 2 by mouth in the pm   UNABLE TO FIND    Med  Name: Dicalcium Malate 150/250 mg, 1 by mouth twice daily   UNABLE TO FIND    Med Name: Iodoral 12.5 mg 1 by mouth daily   UNABLE TO FIND    Med Name: Itis Care 1 gram 1 by mouth twice daily   UNABLE TO FIND    Med Name: Nutrient 950, 3 by mouth in the am, 2 by mouth in the pm   UNABLE TO FIND    Med Name: Pyridoxal 5-Phosphate 50 mg, 2 by mouth in the pm   UNABLE TO FIND    Med Name: YES EFA's 740 mg 1 by mouth twice daily   VITAMIN A 16109 UNIT CAPSULE    Take 10,000 Units by mouth daily.  Modified Medications   No medications on file  Discontinued Medications   No medications on file    Physical Exam:  Vitals:   03/31/19 1513  Weight: 160 lb (72.6 kg)  Height: 5\' 11"  (1.803 m)   Body mass index is 22.32 kg/m. Wt Readings from Last 3 Encounters:  03/31/19 160 lb (72.6 kg)  01/22/19 154 lb (69.9 kg)  01/21/19 154 lb 6.4 oz (70 kg)    Physical Exam Constitutional:      Appearance: Normal appearance.  Neurological:     Mental Status: He is alert.  Psychiatric:        Mood and Affect: Mood is anxious.        Behavior: Behavior is agitated.     Labs reviewed: Basic Metabolic Panel: Recent  Labs    06/22/18 1230 12/04/18 0804  NA 139 142  K 4.3 4.6  CL 102 103  CO2 32 32  GLUCOSE 86 78  BUN 22 22  CREATININE 0.97 0.93  CALCIUM 9.8 9.7  TSH 2.34 3.15   Liver Function Tests: Recent Labs    12/04/18 0804  AST 34  ALT 28  BILITOT 0.8  PROT 6.8   No results for input(s): LIPASE, AMYLASE in the last 8760 hours. No results for input(s): AMMONIA in the last 8760 hours. CBC: Recent Labs    06/22/18 1230 12/04/18 0804  WBC 7.5 7.6  NEUTROABS 4.7 4,134  HGB 14.6 15.3  HCT 42.7 45.0  MCV 88.2 88.1  PLT 190.0 239   Lipid Panel: Recent Labs    12/04/18 0804  CHOL 197  HDL 86  LDLCALC 97  TRIG 55  CHOLHDL 2.3   TSH: Recent Labs    06/22/18 1230 12/04/18 0804  TSH 2.34 3.15   A1C: No results found for: HGBA1C   Assessment/Plan 1. Chronic right-sided low back pain without sciatica -pt reports he will not get imagining (xray) and repeated this several times. He would not allow for proper assessment of the pain because he kept asking "are you going to prescribe this medication or not" He does report that he can not take iburofen but has not tried tylenol. Attempted to educate that tylenol was in his hydrocodone-apap and not an NSAID. When asking if he had tried tylenol for the pain he stated he could not take ibuprofen. Pt would not allow for education regarding adverse effects of hydrocodone/apap-  States "cars are dangerous but we drive them anyway"  stated he is only here today for refill on hydrocodone and reported he would leave the building if refill was not provided. When trying to assess pain and discuss alternative treatment he became visible upset and pt got up and left. On the way out he stated if I would  not give him his prescription his old doctor would.    Carlos American. Van, [facility] [phone]

## 2019-04-01 ENCOUNTER — Ambulatory Visit (INDEPENDENT_AMBULATORY_CARE_PROVIDER_SITE_OTHER): Payer: PPO | Admitting: Emergency Medicine

## 2019-04-01 ENCOUNTER — Other Ambulatory Visit: Payer: Self-pay

## 2019-04-01 ENCOUNTER — Encounter: Payer: Self-pay | Admitting: Emergency Medicine

## 2019-04-01 VITALS — BP 122/78 | HR 55 | Temp 98.1°F | Resp 15 | Ht 71.0 in | Wt 159.0 lb

## 2019-04-01 DIAGNOSIS — M545 Low back pain: Secondary | ICD-10-CM

## 2019-04-01 DIAGNOSIS — R7989 Other specified abnormal findings of blood chemistry: Secondary | ICD-10-CM | POA: Diagnosis not present

## 2019-04-01 DIAGNOSIS — G8929 Other chronic pain: Secondary | ICD-10-CM

## 2019-04-01 DIAGNOSIS — R531 Weakness: Secondary | ICD-10-CM

## 2019-04-01 DIAGNOSIS — E291 Testicular hypofunction: Secondary | ICD-10-CM

## 2019-04-01 MED ORDER — HYDROCODONE-ACETAMINOPHEN 5-325 MG PO TABS: 1.0000 | ORAL_TABLET | Freq: Four times a day (QID) | ORAL | 0 refills | Status: DC | PRN

## 2019-04-01 NOTE — Progress Notes (Signed)
Jason Blevins 69 y.o.   Chief Complaint  Patient presents with  . Medication Refill    pt requesting refill Hydro/Apap 5-325, pt states the medication is working very well, pt denies any side effects     HISTORY OF PRESENT ILLNESS: This is a 69 y.o. male with history of chronic back pain and occasional rare use of acetaminophen-hydrocodone requesting refill. Also has a chronic history of general weakness with some dyspnea-fatigue on exertion.  Unable to engage in usual physical/sports activities as before affecting his quality of life. Testosterone levels in the past have been low. No new symptoms, complaints, or medical concerns today.  HPI   Prior to Admission medications   Medication Sig Start Date End Date Taking? Authorizing Provider  Ascorbic Acid (VITAMIN C WITH ROSE HIPS) 500 MG tablet 1000/500 mg 1 by mouth twice daily   Yes [provider]  BETAINE PO Take 521 mg by mouth 2 (two) times daily.   Yes [provider]  Cholecalciferol 25 MCG (1000 UT) capsule Take 2,000 Units by mouth daily.   Yes [provider]  DHEA 25 MG CAPS Take 50 mg by mouth daily.   Yes [provider]  HYDROcodone-acetaminophen (NORCO/VICODIN) 5-325 MG tablet Take 1 tablet by mouth every 6 (six) hours as needed for moderate pain. 04/01/19  Yes Daelen Belvedere, Ines Bloomer, MD  IRON PO 1,100 mg. 2 by mouth in the am and 1 by mouth in the pm   Yes [provider]  Magnesium 250 MG TABS Take 1 tablet by mouth daily.   Yes [provider]  MELATONIN PO Take by mouth. 1-3 mg as needed   Yes [provider]  Multiple Vitamins-Minerals (ZINC PO) Take 30 mg by mouth 2 (two) times daily.   Yes [provider]  Turmeric 500 MG CAPS Take 1 capsule by mouth daily.   Yes [provider]  UNABLE TO FIND Med Name: Russion choice immune, 2 by mouth in the am, 2 by mouth in the pm   Yes [provider]  [place] Name: Dicalcium  Malate 150/250 mg, 1 by mouth twice daily   Yes [provider]  [place] Name: Iodoral 12.5 mg 1 by mouth daily   Yes [provider]  [place] Name: Itis Care 1 gram 1 by mouth twice daily   Yes [provider]  [place] Name: Nutrient 950, 3 by mouth in the am, 2 by mouth in the pm   Yes [provider]  [place] Name: Pyridoxal 5-Phosphate 50 mg, 2 by mouth in the pm   Yes [provider]  [place] Name: YES EFA's 740 mg 1 by mouth twice daily   Yes [provider]  vitamin A 10000 UNIT capsule Take 10,000 Units by mouth daily.   Yes [provider]    No Known Allergies  Patient Active Problem List   Diagnosis Date Noted  . Dyspnea on exertion 04/07/2018  . Cough 04/07/2018  . General weakness 10/15/2017  . Decreased stamina 10/15/2017  . Bite, insect 08/06/2016  . Itching 08/06/2016  . Chronic right-sided low back pain without sciatica 07/03/2016    Past Medical History:  Diagnosis Date  . Allergy   . Anxiety   . Asthma    AS CHILD  . Blood transfusion without reported diagnosis   . Cataract    BEGINNING  .  Skin cancer    Per [facility] New Patient Packet    Past Surgical History:  Procedure Laterality Date  . MOHS SURGERY  1994   Per [facility] New Patient Packet  . SPINE SURGERY      Social History   Socioeconomic History  . Marital status: Married    Spouse name: Not on file  . Number of children: Not on file  . Years of education: Not on file  . Highest education level: Not on file  Occupational History  . Not on file  Tobacco Use  . Smoking status: Never Smoker  . Smokeless tobacco: Never Used  Substance and Sexual Activity  . Alcohol use: Yes    Comment: 2-3 weekly   . Drug use: No  . Sexual activity: Yes  Other Topics Concern  . Not on file  Social History Narrative   Per [facility] New Patient Packet abstracted 11/04/2018      Diet: N/A       Caffeine: Yes      Married, if yes what year: Separated, [place] in [place] you live in a house, apartment, assisted living, condo, trailer, ect: House, one stories, one person       Pets: Dog, bassett hound       Current/Past profession: Conservator, museum/gallery, educator       Exercise: walking/workout whe possible          Living Will: yes   DNR: yes   POA/HPOA: yes      Functional Status:   Do you have difficulty bathing or dressing yourself? No   Do you have difficulty preparing food or eating? No   Do you have difficulty managing your medications? No   Do you have difficulty managing your finances? No    Do you have difficulty affording your medications? No   Social Determinants of Health   Financial Resource Strain:   . Difficulty of Paying Living Expenses: Not on file  Food Insecurity:   . Worried About Charity fundraiser in the Last Year: Not on file  . Ran Out of Food in the Last Year: Not on file  Transportation Needs:   . Lack of Transportation (Medical): Not on file  . Lack of Transportation (Non-Medical): Not on file  Physical Activity:   . Days of Exercise per Week: Not on file  . Minutes of Exercise per Session: Not on file  Stress:   . Feeling of Stress : Not on file  Social Connections:   . Frequency of Communication with Friends and Family: Not on file  . Frequency of Social Gatherings with Friends and Family: Not on file  . Attends Religious Services: Not on file  . Active Member of Clubs or Organizations: Not on file  . Attends Archivist Meetings: Not on file  . Marital Status: Not on file  Intimate Partner Violence:   . Fear of Current or Ex-Partner: Not on file  . Emotionally Abused: Not on file  . Physically Abused: Not on file  . Sexually Abused: Not on file    Family History  Problem Relation Age of Onset  . Cancer Mother        unsure what type   . Arthritis Sister   . Cancer Sister 60       skin  . Heart disease  Maternal Grandfather   . Colon cancer Neg Hx   . Colon polyps Neg Hx   .  Esophageal cancer Neg Hx   . Rectal cancer Neg Hx   . Stomach cancer Neg Hx      Review of Systems  Constitutional: Negative.  Negative for chills and fever.  HENT: Negative.  Negative for congestion and sore throat.   Respiratory:       Dyspnea on exertion  Cardiovascular: Negative.  Negative for chest pain and palpitations.  Gastrointestinal: Negative.  Negative for abdominal pain, blood in stool, diarrhea, melena, nausea and vomiting.  Genitourinary: Negative.  Negative for dysuria, flank pain and hematuria.  Musculoskeletal: Positive for back pain.  Skin: Negative.  Negative for rash.       History of localized skin cancer in the past  Neurological: Positive for weakness. Negative for dizziness and headaches.  All other systems reviewed and are negative.  Today's Vitals   04/01/19 1423  BP: 122/78  Pulse: (!) 55  Resp: 15  Temp: 98.1 F (36.7 C)  TempSrc: Temporal  SpO2: 96%  Weight: 159 lb (72.1 kg)  Height: 5\' 11"  (1.803 m)   Body mass index is 22.18 kg/m.   Physical Exam Vitals reviewed.  Constitutional:      Appearance: Normal appearance.  HENT:     Head: Normocephalic.  Eyes:     Extraocular Movements: Extraocular movements intact.     Pupils: Pupils are equal, round, and reactive to light.  Cardiovascular:     Rate and Rhythm: Normal rate.  Pulmonary:     Effort: Pulmonary effort is normal.  Musculoskeletal:        General: Normal range of motion.     Cervical back: Normal range of motion and neck supple.  Skin:    General: Skin is warm and dry.     Capillary Refill: Capillary refill takes less than 2 seconds.  Neurological:     General: No focal deficit present.     Mental Status: He is alert and oriented to person, place, and time.  Psychiatric:        Mood and Affect: Mood normal.        Behavior: Behavior normal.      ASSESSMENT & PLAN: [place] was seen today for  medication refill.  Diagnoses and all orders for this visit:  Hypogonadism in male -     TestT+TestF+SHBG -     Ambulatory referral to Endocrinology  Chronic right-sided low back pain without sciatica -     HYDROcodone-acetaminophen (NORCO/VICODIN) 5-325 MG tablet; Take 1 tablet by mouth every 6 (six) hours as needed for moderate pain.  Low testosterone in male  General weakness    Patient Instructions       If you have lab work done today you will be contacted with your lab results within the next 2 weeks.  If you have not heard from [place] then please contact us. The fastest way to get your results is to register for My Chart.   IF you received an x-ray today, you will receive an invoice from [facility]. Please contact [facility] at [phone] with questions or concerns regarding your invoice.   IF you received labwork today, you will receive an invoice from [facility]. Please contact [facility] at [phone] with questions or concerns regarding your invoice.   Our billing staff will not be able to assist you with questions regarding bills from these companies.  You will be contacted with the lab results as soon as they are available. The fastest way to get your results is to activate your My  Chart account. Instructions are located on the last page of this paperwork. If you have not heard from [place] regarding the results in 2 weeks, please contact this office.     Health Maintenance After Age 23 After age 72, you are at a higher risk for certain long-term diseases and infections as well as injuries from falls. Falls are a major cause of broken bones and head injuries in people who are older than age 25. Getting regular preventive care can help to keep you healthy and well. Preventive care includes getting regular testing and making lifestyle changes as recommended by your health care provider. Talk with your health care provider about:  Which screenings and tests  you should have. A screening is a test that checks for a disease when you have no symptoms.  A diet and exercise plan that is right for you. What should I know about screenings and tests to prevent falls? Screening and testing are the best ways to find a health problem early. Early diagnosis and treatment give you the best chance of managing medical conditions that are common after age 85. Certain conditions and lifestyle choices may make you more likely to have a fall. Your health care provider may recommend:  Regular vision checks. Poor vision and conditions such as cataracts can make you more likely to have a fall. If you wear glasses, make sure to get your prescription updated if your vision changes.  Medicine review. Work with your health care provider to regularly review all of the medicines you are taking, including over-the-counter medicines. Ask your health care provider about any side effects that may make you more likely to have a fall. Tell your health care provider if any medicines that you take make you feel dizzy or sleepy.  Osteoporosis screening. Osteoporosis is a condition that causes the bones to get weaker. This can make the bones weak and cause them to break more easily.  Blood pressure screening. Blood pressure changes and medicines to control blood pressure can make you feel dizzy.  Strength and balance checks. Your health care provider may recommend certain tests to check your strength and balance while standing, walking, or changing positions.  Foot health exam. Foot pain and numbness, as well as not wearing proper footwear, can make you more likely to have a fall.  Depression screening. You may be more likely to have a fall if you have a fear of falling, feel emotionally low, or feel unable to do activities that you used to do.  Alcohol use screening. Using too much alcohol can affect your balance and may make you more likely to have a fall. What actions can I take to  lower my risk of falls? General instructions  Talk with your health care provider about your risks for falling. Tell your health care provider if: ? You fall. Be sure to tell your health care provider about all falls, even ones that seem minor. ? You feel dizzy, sleepy, or off-balance.  Take over-the-counter and prescription medicines only as told by your health care provider. These include any supplements.  Eat a healthy diet and maintain a healthy weight. A healthy diet includes low-fat dairy products, low-fat (lean) meats, and fiber from whole grains, beans, and lots of fruits and vegetables. Home safety  Remove any tripping hazards, such as rugs, cords, and clutter.  Install safety equipment such as grab bars in bathrooms and safety rails on stairs.  Keep rooms and walkways well-lit. Activity   Follow  a regular exercise program to stay fit. This will help you maintain your balance. Ask your health care provider what types of exercise are appropriate for you.  If you need a cane or walker, use it as recommended by your health care provider.  Wear supportive shoes that have nonskid soles. Lifestyle  Do not drink alcohol if your health care provider tells you not to drink.  If you drink alcohol, limit how much you have: ? 0-1 drink a day for women. ? 0-2 drinks a day for men.  Be aware of how much alcohol is in your drink. In the [place] one drink equals one typical bottle of beer (12 oz), one-half glass of wine (5 oz), or one shot of hard liquor (1 oz).  Do not use any products that contain nicotine or tobacco, such as cigarettes and e-cigarettes. If you need help quitting, ask your health care provider. Summary  Having a healthy lifestyle and getting preventive care can help to protect your health and wellness after age 87.  Screening and testing are the best way to find a health problem early and help you avoid having a fall. Early diagnosis and treatment give you the best  chance for managing medical conditions that are more common for people who are older than age 80.  Falls are a major cause of broken bones and head injuries in people who are older than age 46. Take precautions to prevent a fall at home.  Work with your health care provider to learn what changes you can make to improve your health and wellness and to prevent falls. This information is not intended to replace advice given to you by your health care provider. Make sure you discuss any questions you have with your health care provider. Document Revised: 05/14/2018 Document Reviewed: 12/04/2016 Elsevier Patient Education  2020 Elsevier Inc.       Agustina Caroli, MD [facility]

## 2019-04-01 NOTE — Patient Instructions (Addendum)
   If you have lab work done today you will be contacted with your lab results within the next 2 weeks.  If you have not heard from us then please contact us. The fastest way to get your results is to register for My Chart.   IF you received an x-ray today, you will receive an invoice from [facility]. Please contact [facility] at [phone] with questions or concerns regarding your invoice.   IF you received labwork today, you will receive an invoice from [facility]. Please contact [facility] at [phone] with questions or concerns regarding your invoice.   Our billing staff will not be able to assist you with questions regarding bills from these companies.  You will be contacted with the lab results as soon as they are available. The fastest way to get your results is to activate your My Chart account. Instructions are located on the last page of this paperwork. If you have not heard from us regarding the results in 2 weeks, please contact this office.     Health Maintenance After Age 65 After age 65, you are at a higher risk for certain long-term diseases and infections as well as injuries from falls. Falls are a major cause of broken bones and head injuries in people who are older than age 65. Getting regular preventive care can help to keep you healthy and well. Preventive care includes getting regular testing and making lifestyle changes as recommended by your health care provider. Talk with your health care provider about:  Which screenings and tests you should have. A screening is a test that checks for a disease when you have no symptoms.  A diet and exercise plan that is right for you. What should I know about screenings and tests to prevent falls? Screening and testing are the best ways to find a health problem early. Early diagnosis and treatment give you the best chance of managing medical conditions that are common after age 65. Certain conditions and  lifestyle choices may make you more likely to have a fall. Your health care provider may recommend:  Regular vision checks. Poor vision and conditions such as cataracts can make you more likely to have a fall. If you wear glasses, make sure to get your prescription updated if your vision changes.  Medicine review. Work with your health care provider to regularly review all of the medicines you are taking, including over-the-counter medicines. Ask your health care provider about any side effects that may make you more likely to have a fall. Tell your health care provider if any medicines that you take make you feel dizzy or sleepy.  Osteoporosis screening. Osteoporosis is a condition that causes the bones to get weaker. This can make the bones weak and cause them to break more easily.  Blood pressure screening. Blood pressure changes and medicines to control blood pressure can make you feel dizzy.  Strength and balance checks. Your health care provider may recommend certain tests to check your strength and balance while standing, walking, or changing positions.  Foot health exam. Foot pain and numbness, as well as not wearing proper footwear, can make you more likely to have a fall.  Depression screening. You may be more likely to have a fall if you have a fear of falling, feel emotionally low, or feel unable to do activities that you used to do.  Alcohol use screening. Using too much alcohol can affect your balance and may make you more likely to   have a fall. What actions can I take to lower my risk of falls? General instructions  Talk with your health care provider about your risks for falling. Tell your health care provider if: ? You fall. Be sure to tell your health care provider about all falls, even ones that seem minor. ? You feel dizzy, sleepy, or off-balance.  Take over-the-counter and prescription medicines only as told by your health care provider. These include any  supplements.  Eat a healthy diet and maintain a healthy weight. A healthy diet includes low-fat dairy products, low-fat (lean) meats, and fiber from whole grains, beans, and lots of fruits and vegetables. Home safety  Remove any tripping hazards, such as rugs, cords, and clutter.  Install safety equipment such as grab bars in bathrooms and safety rails on stairs.  Keep rooms and walkways well-lit. Activity   Follow a regular exercise program to stay fit. This will help you maintain your balance. Ask your health care provider what types of exercise are appropriate for you.  If you need a cane or walker, use it as recommended by your health care provider.  Wear supportive shoes that have nonskid soles. Lifestyle  Do not drink alcohol if your health care provider tells you not to drink.  If you drink alcohol, limit how much you have: ? 0-1 drink a day for women. ? 0-2 drinks a day for men.  Be aware of how much alcohol is in your drink. In the [place] one drink equals one typical bottle of beer (12 oz), one-half glass of wine (5 oz), or one shot of hard liquor (1 oz).  Do not use any products that contain nicotine or tobacco, such as cigarettes and e-cigarettes. If you need help quitting, ask your health care provider. Summary  Having a healthy lifestyle and getting preventive care can help to protect your health and wellness after age 65.  Screening and testing are the best way to find a health problem early and help you avoid having a fall. Early diagnosis and treatment give you the best chance for managing medical conditions that are more common for people who are older than age 65.  Falls are a major cause of broken bones and head injuries in people who are older than age 65. Take precautions to prevent a fall at home.  Work with your health care provider to learn what changes you can make to improve your health and wellness and to prevent falls. This information is not intended  to replace advice given to you by your health care provider. Make sure you discuss any questions you have with your health care provider. Document Revised: 05/14/2018 Document Reviewed: 12/04/2016 Elsevier Patient Education  2020 Elsevier Inc.  

## 2019-04-02 ENCOUNTER — Telehealth: Payer: Self-pay | Admitting: *Deleted

## 2019-04-02 NOTE — Telephone Encounter (Signed)
Patient called and stated that he is NO Longer Listing [facility] or Janett Billow as his PCP. Stated that he is SUGGESTING for Janett Billow to do a better job listening to her patients. Stated that he had a number of visits here and never felt like he was listened to for a minute.   Removed Janett Billow as PCP from Patient's chart.

## 2019-04-02 NOTE — Telephone Encounter (Signed)
Noted and we wish patient the best

## 2019-04-05 LAB — TESTT+TESTF+SHBG
Sex Hormone Binding: 50 nmol/L (ref 19.3–76.4)
Testosterone, Free: 4.2 pg/mL — ABNORMAL LOW (ref 6.6–18.1)
Testosterone, Total, LC/MS: 265.3 ng/dL (ref 264.0–916.0)

## 2019-04-11 ENCOUNTER — Ambulatory Visit: Payer: PPO | Attending: Internal Medicine

## 2019-04-11 DIAGNOSIS — Z23 Encounter for immunization: Secondary | ICD-10-CM | POA: Insufficient documentation

## 2019-04-11 NOTE — Progress Notes (Signed)
   Covid-19 Vaccination Clinic  Name:  Jason Blevins    MRN: [id number] DOB: [birth year]  04/11/2019  [name] was observed post Covid-19 immunization for 15 minutes without incident. He was provided with Vaccine Information Sheet and instruction to access the V-Safe system.   [name] was instructed to call 911 with any severe reactions post vaccine: Marland Kitchen Difficulty breathing  . Swelling of face and throat  . A fast heartbeat  . A bad rash all over body  . Dizziness and weakness   Immunizations Administered    Name Date Dose VIS Date Route   Pfizer COVID-19 Vaccine 04/11/2019  1:53 PM 0.3 mL 01/15/2019 Intramuscular   Manufacturer: [place]   Lot: [id number]   [place]: [id number]

## 2019-04-22 ENCOUNTER — Telehealth: Payer: Self-pay | Admitting: Emergency Medicine

## 2019-04-22 ENCOUNTER — Telehealth: Payer: Self-pay

## 2019-04-22 NOTE — Telephone Encounter (Signed)
Called pt to give info on referral for endo, gave md info for pt to call and make appt.

## 2019-04-22 NOTE — Telephone Encounter (Signed)
Patient called regarding his recent  referral. He has a bad phone # for office was given [phone] and number is disconnected / please reach out to patient with good number .  Thank you !

## 2019-04-27 ENCOUNTER — Telehealth: Payer: Self-pay | Admitting: Emergency Medicine

## 2019-04-27 NOTE — Telephone Encounter (Signed)
Patient called regarding his recent referral on 2/25. He sates the number that was given to him was disconected [phone]. Pt would like someone to reach out to patient with good number.

## 2019-04-30 NOTE — Telephone Encounter (Signed)
Pt called again regarding referral and he would like a different dr for the Endocrinologist referral. [phone] Please advise.

## 2019-06-04 ENCOUNTER — Other Ambulatory Visit: Payer: Self-pay

## 2019-06-08 ENCOUNTER — Ambulatory Visit: Payer: PPO | Admitting: Endocrinology

## 2019-06-08 ENCOUNTER — Encounter: Payer: Self-pay | Admitting: Endocrinology

## 2019-06-08 ENCOUNTER — Other Ambulatory Visit: Payer: Self-pay

## 2019-06-08 DIAGNOSIS — R7989 Other specified abnormal findings of blood chemistry: Secondary | ICD-10-CM

## 2019-06-08 LAB — IBC PANEL
Iron: 113 ug/dL (ref 42–165)
Saturation Ratios: 32.9 % (ref 20.0–50.0)
Transferrin: 245 mg/dL (ref 212.0–360.0)

## 2019-06-08 LAB — PROLACTIN: Prolactin: 3.8 ng/mL (ref 2.0–18.0)

## 2019-06-08 LAB — LUTEINIZING HORMONE: LH: 2.24 m[IU]/mL — ABNORMAL LOW (ref 3.10–34.60)

## 2019-06-08 LAB — FOLLICLE STIMULATING HORMONE: FSH: 6.7 m[IU]/mL (ref 1.4–18.1)

## 2019-06-08 NOTE — Patient Instructions (Signed)
Blood tests are requested for you today.  We'll let you know about the results.  Based on the results, I can prescribe for you a pill to increase the testosterone. Either way, let's plan to recheck the blood test in 4-6 weeks.   Testosterone treatment has risks, including increased or decreased fertility (depending on the type of treatment), hair loss, prostate cancer, benign prostate enlargement, blood clots, liver problems, lower hdl ("good cholesterol"), polycythemia (opposite of anemia), sleep apnea, and behavior changes.

## 2019-06-08 NOTE — Progress Notes (Signed)
Subjective:    Patient ID: Jason Blevins, male    DOB: Dec 16, 1950, 69 y.o.   MRN: [id number]  HPI Pt is referred by Dr [name], for hypogonadism.  Pt reports he had puberty at the normal age.  He has 1 biological child.  He says he has never taken illicit androgens.  He took topical testosterone x 6 mos, in approx 2010.  He does not take antiandrogens or opioids.  He denies any h/o infertility, XRT, or genital infection.  He has never had surgery, or a serious injury to the head or genital area. He has no h/o sleep apnea or DVT.   He does not consume alcohol excessively.  He reports 5 mos of fatigue.    Past Medical History:  Diagnosis Date  . Allergy   . Anxiety   . Asthma    AS CHILD  . Blood transfusion without reported diagnosis   . Cataract    BEGINNING  . Skin cancer    Per [facility] New Patient Packet    Past Surgical History:  Procedure Laterality Date  . MOHS SURGERY  1994   Per [facility] New Patient Packet  . SPINE SURGERY      Social History   Socioeconomic History  . Marital status: Married    Spouse name: Not on file  . Number of children: Not on file  . Years of education: Not on file  . Highest education level: Not on file  Occupational History  . Not on file  Tobacco Use  . Smoking status: Never Smoker  . Smokeless tobacco: Never Used  Substance and Sexual Activity  . Alcohol use: Yes    Comment: 2-3 weekly   . Drug use: No  . Sexual activity: Yes  Other Topics Concern  . Not on file  Social History Narrative   Per [facility] New Patient Packet abstracted 11/04/2018      Diet: N/A      Caffeine: Yes      Married, if yes what year: Separated, married in [place] you live in a house, apartment, assisted living, condo, trailer, ect: House, one stories, one person       Pets: Dog, bassett hound       Current/Past profession: Conservator, museum/gallery, educator       Exercise: walking/workout whe possible          Living Will: yes   DNR: yes   POA/HPOA: yes     Functional Status:   Do you have difficulty bathing or dressing yourself? No   Do you have difficulty preparing food or eating? No   Do you have difficulty managing your medications? No   Do you have difficulty managing your finances? No    Do you have difficulty affording your medications? No   Social Determinants of Health   Financial Resource Strain:   . Difficulty of Paying Living Expenses:   Food Insecurity:   . Worried About Charity fundraiser in the Last Year:   . Arboriculturist in the Last Year:   Transportation Needs:   . Film/video editor (Medical):   Marland Kitchen Lack of Transportation (Non-Medical):   Physical Activity:   . Days of Exercise per Week:   . Minutes of Exercise per Session:   Stress:   . Feeling of Stress :   Social Connections:   . Frequency of Communication with Friends and Family:   . Frequency of  Social Gatherings with Friends and Family:   . Attends Religious Services:   . Active Member of Clubs or Organizations:   . Attends Archivist Meetings:   Marland Kitchen Marital Status:   Intimate Partner Violence:   . Fear of Current or Ex-Partner:   . Emotionally Abused:   Marland Kitchen Physically Abused:   . Sexually Abused:     Current Outpatient Medications on File Prior to Visit  Medication Sig Dispense Refill  . Ascorbic Acid (VITAMIN C WITH ROSE HIPS) 500 MG tablet 1000/500 mg 1 by mouth twice daily    . BETAINE PO Take 521 mg by mouth 2 (two) times daily.    . Cholecalciferol 25 MCG (1000 UT) capsule Take 2,000 Units by mouth daily.    Marland Kitchen DHEA 25 MG CAPS Take 50 mg by mouth daily.    Marland Kitchen HYDROcodone-acetaminophen (NORCO/VICODIN) 5-325 MG tablet Take 1 tablet by mouth every 6 (six) hours as needed for moderate pain. 15 tablet 0  . IRON PO 1,100 mg. 2 by mouth in the am and 1 by mouth in the pm    . Magnesium 250 MG TABS Take 1 tablet by mouth daily.    Marland Kitchen MELATONIN PO Take by mouth. 1-3 mg as needed    . Turmeric 500 MG CAPS Take 1 capsule by mouth daily.    Marland Kitchen  UNABLE TO FIND Med Name: Russion choice immune, 2 by mouth in the am, 2 by mouth in the pm    . UNABLE TO FIND Med Name: Dicalcium Malate 150/250 mg, 1 by mouth twice daily    . UNABLE TO FIND Med Name: Iodoral 12.5 mg 1 by mouth daily    . UNABLE TO FIND Med Name: Itis Care 1 gram 1 by mouth twice daily    . UNABLE TO FIND Med Name: Nutrient 950, 3 by mouth in the am, 2 by mouth in the pm    . UNABLE TO FIND Med Name: Pyridoxal 5-Phosphate 50 mg, 2 by mouth in the pm    . UNABLE TO FIND Med Name: YES EFA's 740 mg 1 by mouth twice daily    . vitamin A 10000 UNIT capsule Take 10,000 Units by mouth daily.     No current facility-administered medications on file prior to visit.    No Known Allergies  Family History  Problem Relation Age of Onset  . Cancer Mother        unsure what type   . Arthritis Sister   . Cancer Sister 60       skin  . Heart disease Maternal Grandfather   . Colon cancer Neg Hx   . Colon polyps Neg Hx   . Esophageal cancer Neg Hx   . Rectal cancer Neg Hx   . Stomach cancer Neg Hx   . Other Neg Hx        low testosterone    BP 112/80   Pulse 65   Ht 5\' 11"  (1.803 m)   Wt 161 lb (73 kg)   SpO2 98%   BMI 22.45 kg/m    Review of Systems denies numbness, erectile dysfunction, weight change, decreased urinary stream, gynecomastia, muscle weakness, and headache.       Objective:   Physical Exam VS: see vs page GEN: no distress HEAD: head: no deformity eyes: no periorbital swelling, no proptosis external nose and ears are normal NECK: supple, thyroid is not enlarged CHEST WALL: no deformity LUNGS: clear to auscultation BREASTS:  No  gynecomastia CV: reg rate and rhythm, no murmur GENITALIA:  Normal male.   MUSCULOSKELETAL: muscle bulk and strength are grossly normal.  no joint swelling is seen  gait is normal and steady.  EXTEMITIES: no leg edema.  PULSES: dorsalis pedis intact bilat.  no carotid bruit NEURO:  cn 2-12 grossly intact.  sensation is  intact to touch on all 4's.   SKIN:  Normal texture and temperature.  No rash or suspicious lesion is visible.  Normal male hair distribution.   NODES:  None palpable at the neck PSYCH: alert, well-oriented.  Does not appear anxious nor depressed.     Lab Results  Component Value Date   TESTOSTERONE 265.6 10/15/2017   Lab Results  Component Value Date   TSH 3.15 12/04/2018   Lab Results  Component Value Date   PSA1 0.9 10/26/2018   PSA1 0.9 05/31/2017   PSA 0.85 10/01/2013     Lab Results  Component Value Date   TESTOSTERONE 230 (L) 06/08/2019   I have reviewed outside records, and summarized: Pt was noted to have low testosterone, and referred here.  He was rx'ed medication for back pain.     Assessment & Plan:  Low back pain, new to me low testosterone, new to me.  Central and idiopathic.  Could be due to narcotics.   Patient Instructions  Blood tests are requested for you today.  We'll let you know about the results.  Based on the results, I can prescribe for you a pill to increase the testosterone. Either way, let's plan to recheck the blood test in 4-6 weeks.   Testosterone treatment has risks, including increased or decreased fertility (depending on the type of treatment), hair loss, prostate cancer, benign prostate enlargement, blood clots, liver problems, lower hdl ("good cholesterol"), polycythemia (opposite of anemia), sleep apnea, and behavior changes.

## 2019-06-10 ENCOUNTER — Other Ambulatory Visit: Payer: Self-pay | Admitting: Endocrinology

## 2019-06-10 DIAGNOSIS — R7989 Other specified abnormal findings of blood chemistry: Secondary | ICD-10-CM

## 2019-06-10 LAB — TESTOSTERONE,FREE AND TOTAL
Testosterone, Free: 6.2 pg/mL — ABNORMAL LOW (ref 6.6–18.1)
Testosterone: 230 ng/dL — ABNORMAL LOW (ref 264–916)

## 2019-06-10 MED ORDER — CLOMIPHENE CITRATE 50 MG PO TABS: ORAL_TABLET | ORAL | 3 refills | Status: DC

## 2019-06-11 ENCOUNTER — Ambulatory Visit: Payer: PPO | Admitting: Nurse Practitioner

## 2019-06-11 ENCOUNTER — Encounter: Payer: Self-pay | Admitting: Endocrinology

## 2019-08-10 ENCOUNTER — Encounter: Payer: Self-pay | Admitting: Endocrinology

## 2019-08-13 ENCOUNTER — Other Ambulatory Visit: Payer: PPO

## 2019-08-13 DIAGNOSIS — R7989 Other specified abnormal findings of blood chemistry: Secondary | ICD-10-CM

## 2019-08-20 LAB — TESTOSTERONE,FREE AND TOTAL
Testosterone, Free: 10.9 pg/mL (ref 6.6–18.1)
Testosterone: 514 ng/dL (ref 264–916)

## 2019-08-31 ENCOUNTER — Other Ambulatory Visit: Payer: Self-pay

## 2019-08-31 DIAGNOSIS — R7989 Other specified abnormal findings of blood chemistry: Secondary | ICD-10-CM

## 2019-08-31 MED ORDER — CLOMIPHENE CITRATE 50 MG PO TABS: ORAL_TABLET | ORAL | 3 refills | Status: DC

## 2019-11-03 ENCOUNTER — Encounter: Payer: Self-pay | Admitting: Emergency Medicine

## 2019-11-09 ENCOUNTER — Encounter: Payer: Self-pay | Admitting: Emergency Medicine

## 2019-11-09 ENCOUNTER — Ambulatory Visit (INDEPENDENT_AMBULATORY_CARE_PROVIDER_SITE_OTHER): Payer: PPO | Admitting: Emergency Medicine

## 2019-11-09 ENCOUNTER — Other Ambulatory Visit: Payer: Self-pay

## 2019-11-09 VITALS — BP 108/67 | HR 56 | Temp 98.3°F | Resp 16 | Ht 71.0 in | Wt 163.0 lb

## 2019-11-09 DIAGNOSIS — Z23 Encounter for immunization: Secondary | ICD-10-CM | POA: Diagnosis not present

## 2019-11-09 DIAGNOSIS — G8929 Other chronic pain: Secondary | ICD-10-CM

## 2019-11-09 DIAGNOSIS — R531 Weakness: Secondary | ICD-10-CM | POA: Diagnosis not present

## 2019-11-09 DIAGNOSIS — M545 Low back pain, unspecified: Secondary | ICD-10-CM | POA: Diagnosis not present

## 2019-11-09 DIAGNOSIS — E291 Testicular hypofunction: Secondary | ICD-10-CM

## 2019-11-09 MED ORDER — HYDROCODONE-ACETAMINOPHEN 5-325 MG PO TABS: 1.0000 | ORAL_TABLET | Freq: Four times a day (QID) | ORAL | 0 refills | Status: DC | PRN

## 2019-11-09 NOTE — Progress Notes (Signed)
Jesus Genera 69 y.o.   Chief Complaint  Patient presents with  . medication review    per patient medication for testosterone    HISTORY OF PRESENT ILLNESS: This is a 69 y.o. male needs vaccine for shingles and influenza.  Also inquiring about Covid booster. History of chronic weakness and fatigue.  Was able to see endocrinologist Dr. Loanne Drilling earlier this year for hypogonadism and started on clomiphene.  Testosterone levels improved but symptoms not quite as much. Still complaining of a general lack of energy.  No other additional symptoms or associated problems. No other complaints or medical concerns today. Requesting refill on hydrocodone prescription which he takes infrequently for occasional back pains.  HPI   Prior to A and fludmission medications   Medication Sig Start Date End Date Taking? Authorizing Provider  Ascorbic Acid (VITAMIN C WITH ROSE HIPS) 500 MG tablet 1000/500 mg 1 by mouth twice daily   Yes [provider]  BETAINE PO Take 521 mg by mouth 2 (two) times daily.   Yes [provider]  Cholecalciferol 25 MCG (1000 UT) capsule Take 2,000 Units by mouth daily.   Yes [provider]  clomiPHENE (CLOMID) 50 MG tablet 1/4 tab daily 08/31/19  Yes Renato Shin, MD  DHEA 25 MG CAPS Take 50 mg by mouth daily.   Yes [provider]  HYDROcodone-acetaminophen (NORCO/VICODIN) 5-325 MG tablet Take 1 tablet by mouth every 6 (six) hours as needed for moderate pain. 04/01/19  Yes Bobette Leyh, Ines Bloomer, MD  IRON PO 1,100 mg. 2 by mouth in the am and 1 by mouth in the pm   Yes [provider]  Magnesium 250 MG TABS Take 1 tablet by mouth daily.   Yes [provider]  MELATONIN PO Take by mouth. 1-3 mg as needed   Yes [provider]  Turmeric 500 MG CAPS Take 1 capsule by mouth daily.   Yes [provider]  UNABLE TO FIND Med Name: [place] choice immune, 2 by mouth in the am, 2 by mouth in the pm   Yes [provider]  [place] Name: Dicalcium Malate 150/250 mg, 1 by mouth twice daily   Yes [provider]  [place] Name: Iodoral 12.5 mg 1 by mouth daily   Yes [provider]  [place] Name: Itis Care 1 gram 1 by mouth twice daily   Yes [provider]  [place] Name: Nutrient 950, 3 by mouth in the am, 2 by mouth in the pm   Yes [provider]  [place] Name: Pyridoxal 5-Phosphate 50 mg, 2 by mouth in the pm   Yes [provider]  [place] Name: YES EFA's 740 mg 1 by mouth twice daily   Yes [provider]  vitamin A 10000 UNIT capsule Take 10,000 Units by mouth daily.   Yes [provider]    No Known Allergies  Patient Active Problem List   Diagnosis Date Noted  . 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 Dyspnea on exertion 04/07/2018  . Cough 04/07/2018  . General weakness 10/15/2017  . Decreased stamina 10/15/2017  . Bite, insect 08/06/2016  . Itching 08/06/2016  . Chronic right-sided low back pain without sciatica 07/03/2016    Past Medical History:  Diagnosis Date  . Allergy   . Anxiety   . Asthma    AS CHILD  . Blood transfusion without  reported diagnosis   . Cataract    BEGINNING  . Skin cancer    Per [facility] New Patient Packet    Past Surgical History:  Procedure Laterality Date  . MOHS SURGERY  1994   Per [facility] New Patient Packet  . SPINE SURGERY      Social History   Socioeconomic History  . Marital status: Married    Spouse name: Not on file  . Number of children: Not on file  . Years of education: Not on file  . Highest education level: Not on file  Occupational History  . Not on file  Tobacco Use  . Smoking status: Never Smoker  . Smokeless tobacco: Never Used  Vaping Use  . Vaping Use: Never used  Substance and Sexual Activity  . Alcohol use: Yes    Comment: 2-3 weekly   . Drug use: No  . Sexual activity: Yes  Other Topics Concern   . Not on file  Social History Narrative   Per [facility] New Patient Packet abstracted 11/04/2018      Diet: N/A      Caffeine: Yes      Married, if yes what year: Separated, married in [place] you live in a house, apartment, assisted living, condo, trailer, ect: House, one stories, one person       Pets: Dog, bassett hound       Current/Past profession: Conservator, museum/gallery, educator       Exercise: walking/workout whe possible          Living Will: yes   DNR: yes   POA/HPOA: yes      Functional Status:   Do you have difficulty bathing or dressing yourself? No   Do you have difficulty preparing food or eating? No   Do you have difficulty managing your medications? No   Do you have difficulty managing your finances? No    Do you have difficulty affording your medications? No   Social Determinants of Health   Financial Resource Strain:   . Difficulty of Paying Living Expenses: Not on file  Food Insecurity:   . Worried About Charity fundraiser in the Last Year: Not on file  . Ran Out of Food in the Last Year: Not on file  Transportation Needs:   . Lack of Transportation (Medical): Not on file  . Lack of Transportation (Non-Medical): Not on file  Physical Activity:   . Days of Exercise per Week: Not on file  . Minutes of Exercise per Session: Not on file  Stress:   . Feeling of Stress : Not on file  Social Connections:   . Frequency of Communication with Friends and Family: Not on file  . Frequency of Social Gatherings with Friends and Family: Not on file  . Attends Religious Services: Not on file  . Active Member of Clubs or Organizations: Not on file  . Attends Archivist Meetings: Not on file  . Marital Status: Not on file  Intimate Partner Violence:   . Fear of Current or Ex-Partner: Not on file  . Emotionally Abused: Not on file  . Physically Abused: Not on file  . Sexually Abused: Not on file    Family History  Problem Relation Age of Onset  .  Cancer Mother        unsure what type   . Arthritis Sister   . Cancer Sister 60       skin  .  Heart disease Maternal Grandfather   . Colon cancer Neg Hx   . Colon polyps Neg Hx   . Esophageal cancer Neg Hx   . Rectal cancer Neg Hx   . Stomach cancer Neg Hx   . Other Neg Hx        low testosterone     Review of Systems  Constitutional: Positive for malaise/fatigue. Negative for chills and fever.  HENT: Negative.  Negative for congestion and sore throat.   Respiratory: Negative.  Negative for cough and shortness of breath.   Cardiovascular: Negative.  Negative for chest pain and palpitations.  Gastrointestinal: Negative.  Negative for abdominal pain, diarrhea, nausea and vomiting.  Genitourinary: Negative.  Negative for hematuria.  Musculoskeletal: Negative.  Negative for back pain, myalgias and neck pain.  Skin: Negative.  Negative for rash.  Neurological: Negative.  Negative for dizziness and headaches.  All other systems reviewed and are negative.  Today's Vitals   11/09/19 1503  BP: 108/67  Pulse: (!) 56  Resp: 16  Temp: 98.3 F (36.8 C)  TempSrc: Temporal  SpO2: 96%  Weight: 163 lb (73.9 kg)  Height: 5\' 11"  (1.803 m)   Body mass index is 22.73 kg/m.   Physical Exam Vitals reviewed.  Constitutional:      Appearance: Normal appearance.  HENT:     Head: Normocephalic.  Eyes:     Extraocular Movements: Extraocular movements intact.     Pupils: Pupils are equal, round, and reactive to light.  Neck:     Vascular: No carotid bruit.  Cardiovascular:     Rate and Rhythm: Normal rate and regular rhythm.     Pulses: Normal pulses.     Heart sounds: Normal heart sounds.  Pulmonary:     Effort: Pulmonary effort is normal.     Breath sounds: Normal breath sounds.  Musculoskeletal:        General: Normal range of motion.     Cervical back: Normal range of motion and neck supple.     Right lower leg: No edema.     Left lower leg: No edema.  Lymphadenopathy:      Cervical: No cervical adenopathy.  Skin:    Capillary Refill: Capillary refill takes less than 2 seconds.  Neurological:     General: No focal deficit present.     Mental Status: He is alert and oriented to person, place, and time.  Psychiatric:        Mood and Affect: Mood normal.        Behavior: Behavior normal.      ASSESSMENT & PLAN: Clinically stable.  No medical concerns identified during this visit.  Continue present medications.  No changes. Timonthy was seen today for medication review.  Diagnoses and all orders for this visit:  General weakness  Chronic right-sided low back pain without sciatica -     HYDROcodone-acetaminophen (NORCO/VICODIN) 5-325 MG tablet; Take 1 tablet by mouth every 6 (six) hours as needed for moderate pain.  Hypogonadism in male  Need for shingles vaccine -     Varicella-zoster vaccine IM  Need for prophylactic vaccination and inoculation against influenza -     Flu Vaccine QUAD High Dose(Fluad)    Patient Instructions       If you have lab work done today you will be contacted with your lab results within the next 2 weeks.  If you have not heard from [place] then please contact us. The fastest way to get your results is to  register for My Chart.   IF you received an x-ray today, you will receive an invoice from [facility]. Please contact [facility] at [phone] with questions or concerns regarding your invoice.   IF you received labwork today, you will receive an invoice from [facility]. Please contact [facility] at [phone] with questions or concerns regarding your invoice.   Our billing staff will not be able to assist you with questions regarding bills from these companies.  You will be contacted with the lab results as soon as they are available. The fastest way to get your results is to activate your My Chart account. Instructions are located on the last page of this paperwork. If you have not heard from [place]  regarding the results in 2 weeks, please contact this office.     Health Maintenance After Age 51 After age 50, you are at a higher risk for certain long-term diseases and infections as well as injuries from falls. Falls are a major cause of broken bones and head injuries in people who are older than age 85. Getting regular preventive care can help to keep you healthy and well. Preventive care includes getting regular testing and making lifestyle changes as recommended by your health care provider. Talk with your health care provider about:  Which screenings and tests you should have. A screening is a test that checks for a disease when you have no symptoms.  A diet and exercise plan that is right for you. What should I know about screenings and tests to prevent falls? Screening and testing are the best ways to find a health problem early. Early diagnosis and treatment give you the best chance of managing medical conditions that are common after age 54. Certain conditions and lifestyle choices may make you more likely to have a fall. Your health care provider may recommend:  Regular vision checks. Poor vision and conditions such as cataracts can make you more likely to have a fall. If you wear glasses, make sure to get your prescription updated if your vision changes.  Medicine review. Work with your health care provider to regularly review all of the medicines you are taking, including over-the-counter medicines. Ask your health care provider about any side effects that may make you more likely to have a fall. Tell your health care provider if any medicines that you take make you feel dizzy or sleepy.  Osteoporosis screening. Osteoporosis is a condition that causes the bones to get weaker. This can make the bones weak and cause them to break more easily.  Blood pressure screening. Blood pressure changes and medicines to control blood pressure can make you feel dizzy.  Strength and balance  checks. Your health care provider may recommend certain tests to check your strength and balance while standing, walking, or changing positions.  Foot health exam. Foot pain and numbness, as well as not wearing proper footwear, can make you more likely to have a fall.  Depression screening. You may be more likely to have a fall if you have a fear of falling, feel emotionally low, or feel unable to do activities that you used to do.  Alcohol use screening. Using too much alcohol can affect your balance and may make you more likely to have a fall. What actions can I take to lower my risk of falls? General instructions  Talk with your health care provider about your risks for falling. Tell your health care provider if: ? You fall. Be sure to tell your health  care provider about all falls, even ones that seem minor. ? You feel dizzy, sleepy, or off-balance.  Take over-the-counter and prescription medicines only as told by your health care provider. These include any supplements.  Eat a healthy diet and maintain a healthy weight. A healthy diet includes low-fat dairy products, low-fat (lean) meats, and fiber from whole grains, beans, and lots of fruits and vegetables. Home safety  Remove any tripping hazards, such as rugs, cords, and clutter.  Install safety equipment such as grab bars in bathrooms and safety rails on stairs.  Keep rooms and walkways well-lit. Activity   Follow a regular exercise program to stay fit. This will help you maintain your balance. Ask your health care provider what types of exercise are appropriate for you.  If you need a cane or walker, use it as recommended by your health care provider.  Wear supportive shoes that have nonskid soles. Lifestyle  Do not drink alcohol if your health care provider tells you not to drink.  If you drink alcohol, limit how much you have: ? 0-1 drink a day for women. ? 0-2 drinks a day for men.  Be aware of how much alcohol is  in your drink. In the [place] one drink equals one typical bottle of beer (12 oz), one-half glass of wine (5 oz), or one shot of hard liquor (1 oz).  Do not use any products that contain nicotine or tobacco, such as cigarettes and e-cigarettes. If you need help quitting, ask your health care provider. Summary  Having a healthy lifestyle and getting preventive care can help to protect your health and wellness after age 75.  Screening and testing are the best way to find a health problem early and help you avoid having a fall. Early diagnosis and treatment give you the best chance for managing medical conditions that are more common for people who are older than age 54.  Falls are a major cause of broken bones and head injuries in people who are older than age 87. Take precautions to prevent a fall at home.  Work with your health care provider to learn what changes you can make to improve your health and wellness and to prevent falls. This information is not intended to replace advice given to you by your health care provider. Make sure you discuss any questions you have with your health care provider. Document Revised: 05/14/2018 Document Reviewed: 12/04/2016 Elsevier Patient Education  2020 Elsevier Inc.      Agustina Caroli, MD [facility]

## 2019-11-09 NOTE — Patient Instructions (Addendum)
   If you have lab work done today you will be contacted with your lab results within the next 2 weeks.  If you have not heard from us then please contact us. The fastest way to get your results is to register for My Chart.   IF you received an x-ray today, you will receive an invoice from [facility]. Please contact [facility] at [phone] with questions or concerns regarding your invoice.   IF you received labwork today, you will receive an invoice from [facility]. Please contact [facility] at [phone] with questions or concerns regarding your invoice.   Our billing staff will not be able to assist you with questions regarding bills from these companies.  You will be contacted with the lab results as soon as they are available. The fastest way to get your results is to activate your My Chart account. Instructions are located on the last page of this paperwork. If you have not heard from us regarding the results in 2 weeks, please contact this office.     Health Maintenance After Age 65 After age 71, you are at a higher risk for certain long-term diseases and infections as well as injuries from falls. Falls are a major cause of broken bones and head injuries in people who are older than age 71. Getting regular preventive care can help to keep you healthy and well. Preventive care includes getting regular testing and making lifestyle changes as recommended by your health care provider. Talk with your health care provider about:  Which screenings and tests you should have. A screening is a test that checks for a disease when you have no symptoms.  A diet and exercise plan that is right for you. What should I know about screenings and tests to prevent falls? Screening and testing are the best ways to find a health problem early. Early diagnosis and treatment give you the best chance of managing medical conditions that are common after age 71. Certain conditions and  lifestyle choices may make you more likely to have a fall. Your health care provider may recommend:  Regular vision checks. Poor vision and conditions such as cataracts can make you more likely to have a fall. If you wear glasses, make sure to get your prescription updated if your vision changes.  Medicine review. Work with your health care provider to regularly review all of the medicines you are taking, including over-the-counter medicines. Ask your health care provider about any side effects that may make you more likely to have a fall. Tell your health care provider if any medicines that you take make you feel dizzy or sleepy.  Osteoporosis screening. Osteoporosis is a condition that causes the bones to get weaker. This can make the bones weak and cause them to break more easily.  Blood pressure screening. Blood pressure changes and medicines to control blood pressure can make you feel dizzy.  Strength and balance checks. Your health care provider may recommend certain tests to check your strength and balance while standing, walking, or changing positions.  Foot health exam. Foot pain and numbness, as well as not wearing proper footwear, can make you more likely to have a fall.  Depression screening. You may be more likely to have a fall if you have a fear of falling, feel emotionally low, or feel unable to do activities that you used to do.  Alcohol use screening. Using too much alcohol can affect your balance and may make you more likely to   have a fall. What actions can I take to lower my risk of falls? General instructions  Talk with your health care provider about your risks for falling. Tell your health care provider if: ? You fall. Be sure to tell your health care provider about all falls, even ones that seem minor. ? You feel dizzy, sleepy, or off-balance.  Take over-the-counter and prescription medicines only as told by your health care provider. These include any  supplements.  Eat a healthy diet and maintain a healthy weight. A healthy diet includes low-fat dairy products, low-fat (lean) meats, and fiber from whole grains, beans, and lots of fruits and vegetables. Home safety  Remove any tripping hazards, such as rugs, cords, and clutter.  Install safety equipment such as grab bars in bathrooms and safety rails on stairs.  Keep rooms and walkways well-lit. Activity   Follow a regular exercise program to stay fit. This will help you maintain your balance. Ask your health care provider what types of exercise are appropriate for you.  If you need a cane or walker, use it as recommended by your health care provider.  Wear supportive shoes that have nonskid soles. Lifestyle  Do not drink alcohol if your health care provider tells you not to drink.  If you drink alcohol, limit how much you have: ? 0-1 drink a day for women. ? 0-2 drinks a day for men.  Be aware of how much alcohol is in your drink. In the [place] one drink equals one typical bottle of beer (12 oz), one-half glass of wine (5 oz), or one shot of hard liquor (1 oz).  Do not use any products that contain nicotine or tobacco, such as cigarettes and e-cigarettes. If you need help quitting, ask your health care provider. Summary  Having a healthy lifestyle and getting preventive care can help to protect your health and wellness after age 71.  Screening and testing are the best way to find a health problem early and help you avoid having a fall. Early diagnosis and treatment give you the best chance for managing medical conditions that are more common for people who are older than age 71.  Falls are a major cause of broken bones and head injuries in people who are older than age 71. Take precautions to prevent a fall at home.  Work with your health care provider to learn what changes you can make to improve your health and wellness and to prevent falls. This information is not intended  to replace advice given to you by your health care provider. Make sure you discuss any questions you have with your health care provider. Document Revised: 05/14/2018 Document Reviewed: 12/04/2016 Elsevier Patient Education  2020 Elsevier Inc.  

## 2019-11-11 ENCOUNTER — Encounter: Payer: Self-pay | Admitting: Emergency Medicine

## 2019-11-26 DIAGNOSIS — Z808 Family history of malignant neoplasm of other organs or systems: Secondary | ICD-10-CM | POA: Diagnosis not present

## 2019-11-26 DIAGNOSIS — Z86018 Personal history of other benign neoplasm: Secondary | ICD-10-CM | POA: Diagnosis not present

## 2019-11-26 DIAGNOSIS — L578 Other skin changes due to chronic exposure to nonionizing radiation: Secondary | ICD-10-CM | POA: Diagnosis not present

## 2019-11-26 DIAGNOSIS — D2272 Melanocytic nevi of left lower limb, including hip: Secondary | ICD-10-CM | POA: Diagnosis not present

## 2019-11-26 DIAGNOSIS — I872 Venous insufficiency (chronic) (peripheral): Secondary | ICD-10-CM | POA: Diagnosis not present

## 2019-11-26 DIAGNOSIS — D0439 Carcinoma in situ of skin of other parts of face: Secondary | ICD-10-CM | POA: Diagnosis not present

## 2019-11-26 DIAGNOSIS — D485 Neoplasm of uncertain behavior of skin: Secondary | ICD-10-CM | POA: Diagnosis not present

## 2019-11-26 DIAGNOSIS — D225 Melanocytic nevi of trunk: Secondary | ICD-10-CM | POA: Diagnosis not present

## 2019-11-26 DIAGNOSIS — L821 Other seborrheic keratosis: Secondary | ICD-10-CM | POA: Diagnosis not present

## 2019-11-26 DIAGNOSIS — L570 Actinic keratosis: Secondary | ICD-10-CM | POA: Diagnosis not present

## 2019-11-30 ENCOUNTER — Ambulatory Visit: Payer: PPO | Attending: Internal Medicine

## 2019-11-30 DIAGNOSIS — Z23 Encounter for immunization: Secondary | ICD-10-CM

## 2019-11-30 NOTE — Progress Notes (Signed)
   Cov[facility] Vaccination [facility]  Name:  Jason Blevins    MRN: [id number] DOB: 1951-02-03  11/30/2019  Jason Blevins was observed post Covid-19 immunization for 15 minutes without incident. He was provided with Vaccine Information Sheet and instruction to access the V-Safe system.   Jason Blevins was instructed to call 911 with any severe reactions post vaccine: Marland Kitchen Difficulty breathing  . Swelling of face and throat  . A fast heartbeat  . A bad rash all over body  . Dizziness and weakness

## 2020-01-06 ENCOUNTER — Ambulatory Visit (INDEPENDENT_AMBULATORY_CARE_PROVIDER_SITE_OTHER): Payer: PPO | Admitting: Emergency Medicine

## 2020-01-06 VITALS — BP 108/67 | Ht 71.0 in | Wt 163.0 lb

## 2020-01-06 DIAGNOSIS — Z0000 Encounter for general adult medical examination without abnormal findings: Secondary | ICD-10-CM

## 2020-01-06 NOTE — Progress Notes (Signed)
Presents today for [employer] Visit   Date of last exam: 11-09-2019  Interpreter used for this visit? No  I connected with  Jason Blevins on 01/06/20 by a telephone application and verified that I am speaking with the correct person using two identifiers.   I discussed the limitations of evaluation and management by telemedicine. The patient expressed understanding and agreed to proceed.  Patient location: home  Provider location: in office  I provided 20 minutes of non face - to - face time during this encounter.  Patient Care Team: Patient, No Pcp Per as PCP - General (General Practice) Melissa Noon, [place] as Referring Physician (Optometry) Tanda Rockers, MD as Consulting Physician (Pulmonary Disease)   Other items to address today:   Discussed Eye/Dental Discussed Immunizations     Other Screening: Last screening for diabetes:12-04-2018 Last lipid screening:12-04-2018  ADVANCE DIRECTIVES: Discussed: yes On File: yes Materials Provided: no  Immunization status:  Immunization History  Administered Date(s) Administered  . Fluad Quad(high Dose 65+) 11/04/2018, 11/09/2019  . Hepatitis B 02/05/2004  . Influenza,inj,Quad PF,6+ Mos 10/01/2013, 10/21/2014, 10/15/2017  . Influenza-Unspecified 11/03/2016  . PFIZER SARS-COV-2 Vaccination 03/18/2019, 04/11/2019, 11/30/2019  . Pneumococcal Conjugate-13 10/01/2013  . Pneumococcal Polysaccharide-23 10/21/2014  . Tdap 10/01/2013  . Zoster Recombinat (Shingrix) 11/09/2019     There are no preventive care reminders to display for this patient.   Functional Status Survey: Is the patient deaf or have difficulty hearing?: Yes (has hearing aids does not wear them) Does the patient have difficulty seeing, even when wearing glasses/contacts?: No Does the patient have difficulty concentrating, remembering, or making decisions?: No Does the patient have difficulty walking or climbing stairs?: No Does the  patient have difficulty dressing or bathing?: No Does the patient have difficulty doing errands alone such as visiting a doctor's office or shopping?: No   6CIT Screen 01/06/2020  What Year? 0 points  What month? 0 points  What time? 0 points  Count back from 20 0 points  Months in reverse 0 points  Repeat phrase 0 points  Total Score 0        Clinical Support from 01/06/2020 in [facility] [place]  AUDIT-C Score 7       Home Environment:   Lives in a one story home No trouble climbing stairs Yes grab bars No scattered rugs Adequate lighting/ no clutter   Patient Active Problem List   Diagnosis Date Noted  . 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 Dyspnea on exertion 04/07/2018  . Cough 04/07/2018  . General weakness 10/15/2017  . Decreased stamina 10/15/2017  . Bite, insect 08/06/2016  . Itching 08/06/2016  . Chronic right-sided low back pain without sciatica 07/03/2016     Past Medical History:  Diagnosis Date  . Allergy   . Anxiety   . Asthma    AS CHILD  . Blood transfusion without reported diagnosis   . Cataract    BEGINNING  . Skin cancer    Per [facility] New Patient Packet     Past Surgical History:  Procedure Laterality Date  . MOHS SURGERY  1994   Per [facility] New Patient Packet  . SPINE SURGERY       Family History  Problem Relation Age of Onset  . Cancer Mother        unsure what type   . Arthritis Sister   . Cancer Sister 60       skin  . Heart disease Maternal  Grandfather   . Colon cancer Neg Hx   . Colon polyps Neg Hx   . Esophageal cancer Neg Hx   . Rectal cancer Neg Hx   . Stomach cancer Neg Hx   . Other Neg Hx        low testosterone     Social History   Socioeconomic History  . Marital status: Married    Spouse name: Not on file  . Number of children: Not on file  . Years of education: Not on file  . Highest education level: Not on file  Occupational History  . Not on file  Tobacco Use  . Smoking status: Never Smoker  .  Smokeless tobacco: Never Used  Vaping Use  . Vaping Use: Never used  Substance and Sexual Activity  . Alcohol use: Yes    Comment: 2-3 weekly   . Drug use: No  . Sexual activity: Yes  Other Topics Concern  . Not on file  Social History Narrative   Per [facility] New Patient Packet abstracted 11/04/2018      Diet: N/A      Caffeine: Yes      Married, if yes what year: Separated, married in [place] you live in a house, apartment, assisted living, condo, trailer, ect: House, one stories, one person       Pets: Dog, bassett hound       Current/Past profession: Conservator, museum/gallery, educator       Exercise: walking/workout whe possible          Living Will: yes   DNR: yes   POA/HPOA: yes      Functional Status:   Do you have difficulty bathing or dressing yourself? No   Do you have difficulty preparing food or eating? No   Do you have difficulty managing your medications? No   Do you have difficulty managing your finances? No    Do you have difficulty affording your medications? No   Social Determinants of Health   Financial Resource Strain:   . Difficulty of Paying Living Expenses: Not on file  Food Insecurity:   . Worried About Charity fundraiser in the Last Year: Not on file  . Ran Out of Food in the Last Year: Not on file  Transportation Needs:   . Lack of Transportation (Medical): Not on file  . Lack of Transportation (Non-Medical): Not on file  Physical Activity:   . Days of Exercise per Week: Not on file  . Minutes of Exercise per Session: Not on file  Stress:   . Feeling of Stress : Not on file  Social Connections:   . Frequency of Communication with Friends and Family: Not on file  . Frequency of Social Gatherings with Friends and Family: Not on file  . Attends Religious Services: Not on file  . Active Member of Clubs or Organizations: Not on file  . Attends Archivist Meetings: Not on file  . Marital Status: Not on file  Intimate Partner  Violence:   . Fear of Current or Ex-Partner: Not on file  . Emotionally Abused: Not on file  . Physically Abused: Not on file  . Sexually Abused: Not on file     No Known Allergies   Prior to Admission medications   Medication Sig Start Date End Date Taking? Authorizing Provider  Ascorbic Acid (VITAMIN C WITH ROSE HIPS) 500 MG tablet 1000/500 mg 1 by mouth twice daily  Yes [provider]  BETAINE PO Take 521 mg by mouth 2 (two) times daily.   Yes [provider]  Cholecalciferol 25 MCG (1000 UT) capsule Take 2,000 Units by mouth daily.   Yes [provider]  clomiPHENE (CLOMID) 50 MG tablet 1/4 tab daily 08/31/19  Yes Renato Shin, MD  DHEA 25 MG CAPS Take 50 mg by mouth daily.   Yes [provider]  HYDROcodone-acetaminophen (NORCO/VICODIN) 5-325 MG tablet Take 1 tablet by mouth every 6 (six) hours as needed for moderate pain. 11/09/19  Yes Sagardia, Ines Bloomer, MD  IRON PO 1,100 mg. 2 by mouth in the am and 1 by mouth in the pm   Yes [provider]  Magnesium 250 MG TABS Take 1 tablet by mouth daily.   Yes [provider]  MELATONIN PO Take by mouth. 1-3 mg as needed   Yes [provider]  Turmeric 500 MG CAPS Take 1 capsule by mouth daily.   Yes [provider]  UNABLE TO FIND Med Name: Russion choice immune, 2 by mouth in the am, 2 by mouth in the pm   Yes [provider]  [place] Name: Dicalcium Malate 150/250 mg, 1 by mouth twice daily   Yes [provider]  [place] Name: Iodoral 12.5 mg 1 by mouth daily   Yes [provider]  [place] Name: Itis Care 1 gram 1 by mouth twice daily   Yes [provider]  [place] Name: Nutrient 950, 3 by mouth in the am, 2 by mouth in the pm   Yes [provider]  [place] Name: Pyridoxal 5-Phosphate 50 mg, 2 by mouth in the pm   Yes [provider]  [place]  Name: YES EFA's 740 mg 1 by mouth twice daily   Yes [provider]  vitamin A 10000 UNIT capsule Take 10,000 Units by mouth daily.   Yes [provider]     Depression screen [facility] 2/9 01/06/2020 11/09/2019 04/01/2019 12/11/2018 12/11/2018  Decreased Interest 0 3 0 0 0  Down, Depressed, Hopeless 0 3 0 0 0  PHQ - 2 Score 0 6 0 0 0  Altered sleeping - 1 - - -  Tired, decreased energy - 1 - - -  Change in appetite - 0 - - -  Feeling bad or failure about yourself  - 0 - - -  Trouble concentrating - 0 - - -  Moving slowly or fidgety/restless - 0 - - -  Suicidal thoughts - 0 - - -  PHQ-9 Score - 8 - - -     Fall Risk  01/06/2020 11/09/2019 04/01/2019 03/31/2019 12/11/2018  Falls in the past year? 0 0 0 0 0  Number falls in past yr: 0 - - 0 -  Injury with Fall? 0 - - 0 -  Follow up Falls evaluation completed;Education provided Falls evaluation completed Falls evaluation completed - -      PHYSICAL EXAM: BP 108/67 Comment: taken from a previous visit  Ht 5\' 11"  (1.803 m)   Wt 163 lb (73.9 kg)   BMI 22.73 kg/m    Wt Readings from Last 3 Encounters:  01/06/20 163 lb (73.9 kg)  11/09/19 163 lb (73.9 kg)  06/08/19 161 lb (73 kg)        Education/Counseling provided regarding diet and exercise, prevention of chronic diseases, smoking/tobacco cessation,  if applicable, and reviewed "Covered Medicare Preventive Services."

## 2020-01-06 NOTE — Patient Instructions (Addendum)
Thank you for taking time to come for your Medicare Wellness Visit. I appreciate your ongoing commitment to your health goals. Please review the following plan we discussed and let me know if I can assist you in the future.  Jason Kennedy LPN  Preventive Care 71 Years and Older, Male Preventive care refers to lifestyle choices and visits with your health care provider that can promote health and wellness. This includes:  A yearly physical exam. This is also called an annual well check.  Regular dental and eye exams.  Immunizations.  Screening for certain conditions.  Healthy lifestyle choices, such as diet and exercise. What can I expect for my preventive care visit? Physical exam Your health care provider will check:  Height and weight. These may be used to calculate body mass index (BMI), which is a measurement that tells if you are at a healthy weight.  Heart rate and blood pressure.  Your skin for abnormal spots. Counseling Your health care provider may ask you questions about:  Alcohol, tobacco, and drug use.  Emotional well-being.  Home and relationship well-being.  Sexual activity.  Eating habits.  History of falls.  Memory and ability to understand (cognition).  Work and work Statistician. What immunizations do I need?  Influenza (flu) vaccine  This is recommended every year. Tetanus, diphtheria, and pertussis (Tdap) vaccine  You may need a Td booster every 10 years. Varicella (chickenpox) vaccine  You may need this vaccine if you have not already been vaccinated. Zoster (shingles) vaccine  You may need this after age 71. Pneumococcal conjugate (PCV13) vaccine  One dose is recommended after age 71. Pneumococcal polysaccharide (PPSV23) vaccine  One dose is recommended after age 71. Measles, mumps, and rubella (MMR) vaccine  You may need at least one dose of MMR if you were born in 1957 or later. You may also need a second dose. Meningococcal  conjugate (MenACWY) vaccine  You may need this if you have certain conditions. Hepatitis A vaccine  You may need this if you have certain conditions or if you travel or work in places where you may be exposed to hepatitis A. Hepatitis B vaccine  You may need this if you have certain conditions or if you travel or work in places where you may be exposed to hepatitis B. Haemophilus influenzae type b (Hib) vaccine  You may need this if you have certain conditions. You may receive vaccines as individual doses or as more than one vaccine together in one shot (combination vaccines). Talk with your health care provider about the risks and benefits of combination vaccines. What tests do I need? Blood tests  Lipid and cholesterol levels. These may be checked every 5 years, or more frequently depending on your overall health.  Hepatitis C test.  Hepatitis B test. Screening  Lung cancer screening. You may have this screening every year starting at age 71 if you have a 30-pack-year history of smoking and currently smoke or have quit within the past 15 years.  Colorectal cancer screening. All adults should have this screening starting at age 71 and continuing until age 61. Your health care provider may recommend screening at age 57 if you are at increased risk. You will have tests every 1-10 years, depending on your results and the type of screening test.  Prostate cancer screening. Recommendations will vary depending on your family history and other risks.  Diabetes screening. This is done by checking your blood sugar (glucose) after you have not eaten for  a while (fasting). You may have this done every 1-3 years.  Abdominal aortic aneurysm (AAA) screening. You may need this if you are a current or former smoker.  Sexually transmitted disease (STD) testing. Follow these instructions at home: Eating and drinking  Eat a diet that includes fresh fruits and vegetables, whole grains, lean  protein, and low-fat dairy products. Limit your intake of foods with high amounts of sugar, saturated fats, and salt.  Take vitamin and mineral supplements as recommended by your health care provider.  Do not drink alcohol if your health care provider tells you not to drink.  If you drink alcohol: ? Limit how much you have to 0-2 drinks a day. ? Be aware of how much alcohol is in your drink. In the [place] one drink equals one 12 oz bottle of beer (355 mL), one 5 oz glass of wine (148 mL), or one 1 oz glass of hard liquor (44 mL). Lifestyle  Take daily care of your teeth and gums.  Stay active. Exercise for at least 30 minutes on 5 or more days each week.  Do not use any products that contain nicotine or tobacco, such as cigarettes, e-cigarettes, and chewing tobacco. If you need help quitting, ask your health care provider.  If you are sexually active, practice safe sex. Use a condom or other form of protection to prevent STIs (sexually transmitted infections).  Talk with your health care provider about taking a low-dose aspirin or statin. What's next?  Visit your health care provider once a year for a well check visit.  Ask your health care provider how often you should have your eyes and teeth checked.  Stay up to date on all vaccines. This information is not intended to replace advice given to you by your health care provider. Make sure you discuss any questions you have with your health care provider. Document Revised: 01/15/2018 Document Reviewed: 01/15/2018 Elsevier Patient Education  2020 Elsevier Inc.  

## 2020-01-14 DIAGNOSIS — D485 Neoplasm of uncertain behavior of skin: Secondary | ICD-10-CM | POA: Diagnosis not present

## 2020-01-14 DIAGNOSIS — D0439 Carcinoma in situ of skin of other parts of face: Secondary | ICD-10-CM | POA: Diagnosis not present

## 2020-01-14 DIAGNOSIS — C44311 Basal cell carcinoma of skin of nose: Secondary | ICD-10-CM | POA: Diagnosis not present

## 2020-06-12 ENCOUNTER — Other Ambulatory Visit: Payer: Self-pay

## 2020-06-12 ENCOUNTER — Ambulatory Visit (INDEPENDENT_AMBULATORY_CARE_PROVIDER_SITE_OTHER): Payer: PPO

## 2020-06-12 ENCOUNTER — Encounter: Payer: Self-pay | Admitting: Internal Medicine

## 2020-06-12 ENCOUNTER — Ambulatory Visit: Payer: PPO | Admitting: Internal Medicine

## 2020-06-12 DIAGNOSIS — R0600 Dyspnea, unspecified: Secondary | ICD-10-CM | POA: Diagnosis not present

## 2020-06-12 DIAGNOSIS — R0609 Other forms of dyspnea: Secondary | ICD-10-CM

## 2020-06-12 DIAGNOSIS — J449 Chronic obstructive pulmonary disease, unspecified: Secondary | ICD-10-CM | POA: Diagnosis not present

## 2020-06-12 LAB — BASIC METABOLIC PANEL
BUN: 20 mg/dL (ref 6–23)
CO2: 33 mEq/L — ABNORMAL HIGH (ref 19–32)
Calcium: 10.1 mg/dL (ref 8.4–10.5)
Chloride: 103 mEq/L (ref 96–112)
Creatinine, Ser: 1.06 mg/dL (ref 0.40–1.50)
GFR: 70.47 mL/min (ref >60.00)
Glucose, Bld: 60 mg/dL — ABNORMAL LOW (ref 70–99)
Potassium: 4.7 mEq/L (ref 3.5–5.1)
Sodium: 141 mEq/L (ref 135–145)

## 2020-06-12 LAB — CBC WITH DIFFERENTIAL/PLATELET
Basophils Absolute: 0 10*3/uL (ref 0.0–0.1)
Basophils Relative: 0.5 % (ref 0.0–3.0)
Eosinophils Absolute: 0.1 10*3/uL (ref 0.0–0.7)
Eosinophils Relative: 1.5 % (ref 0.0–5.0)
HCT: 48.1 % (ref 39.0–52.0)
Hemoglobin: 16.2 g/dL (ref 13.0–17.0)
Lymphocytes Relative: 25.5 % (ref 12.0–46.0)
Lymphs Abs: 1.9 10*3/uL (ref 0.7–4.0)
MCHC: 33.8 g/dL (ref 30.0–36.0)
MCV: 89.9 fl (ref 78.0–100.0)
Monocytes Absolute: 0.5 10*3/uL (ref 0.1–1.0)
Monocytes Relative: 6.9 % (ref 3.0–12.0)
Neutro Abs: 4.9 10*3/uL (ref 1.4–7.7)
Neutrophils Relative %: 65.6 % (ref 43.0–77.0)
Platelets: 192 10*3/uL (ref 150.0–400.0)
RBC: 5.35 Mil/uL (ref 4.22–5.81)
RDW: 13.2 % (ref 11.5–15.5)
WBC: 7.5 10*3/uL (ref 4.0–10.5)

## 2020-06-12 LAB — TSH: TSH: 3.22 u[IU]/mL (ref 0.35–4.50)

## 2020-06-12 LAB — BRAIN NATRIURETIC PEPTIDE: Pro B Natriuretic peptide (BNP): 20 pg/mL (ref 0.0–100.0)

## 2020-06-12 NOTE — Patient Instructions (Signed)
To get the most out of exercise, you need to be continuously aware that you are short of breath, but never out of breath, for at least 30 minutes daily. As you improve, it will actually be easier for you to do the same amount of exercise  in  30 minutes so always push to the level where you are short of breath.  Once you can do this, push for longer duration or repeat it after at least 4 hours of rest.  Make sure you check your oxygen saturations at highest level of activity (pulse oximeter will give you both your heart rate and your 02 saturation and we want the saturation over 90% and the pulse up to  130 )    Please remember to go to the lab and x-ray department  for your tests - we will call you with the results when they are available.       CPST will be scheduled for 4 week assuming your heart rate and blood work are ok

## 2020-06-12 NOTE — Progress Notes (Signed)
Jason Blevins, male    DOB: 29-Jan-1951     MRN: [id number]   Brief patient profile:  25 yowm never smoker music teacher/ college communications teacher at [employer] had asthma as child tested in [place]  Pos dogs/cats/grasses on allergy shots ? Benefit around 1962 grew out of it in 1968 age 70  And no problems and no need for inhalers but since then colds tend  to go into chest and need abx from twice a year down once every 2 years and "fine" in between until late 2019 indolent onset worsening sob then acutex 6 m   typical uri much worse than usual  with chest tightness eval by Jason Blevins 12/29/17 rx  augmentin which resolved URI acute symptoms but never back to baseline ex tol   referred to pulmonary clinic 06/22/2018 by Dr Jason Blevins     History of Present Illness  06/22/2018  Pulmonary/ 1st office eval/Jason Blevins  Chief Complaint  Patient presents with  . Pulmonary Consult    Pt c/o DOE x 6 months. He had to stop playing basketball and had to stop due to DOE and he also gets SOB running with his dog.   Dyspnea:  MMRC1 = can walk nl pace, flat grade, can't hurry or go uphills or steps s sob Cough: gone now completely  Sleep: lie flat ok SABA use: no rec  Please remember to go to the lab  Nl / cxr "copd" In the meantime, resume as much regular activity as you can so we can perform an accurate CPST and return for pfts > never done    06/12/2020  f/u ov/Jason Blevins re: doe no better no worse ex tol, no fluctuation in doe or rest/ noct symptoms  Chief Complaint  Patient presents with  . Follow-up    Pt c/o "no stamina"   Dyspnea:  No change ex tol > can only do eliptical slowly x 20 min  And slowed by heart pounding not sob Cough: none  Sleeping: electric bed minimal elevation no resp symptoms and no heart pounding  SABA use: none  02: none  Covid status:   vax 3    No obvious day to day or daytime variability or assoc excess/ purulent sputum or mucus plugs or hemoptysis or cp or chest tightness, subjective  wheeze or overt sinus or hb symptoms.   Sleeping  without nocturnal  or early am exacerbation  of respiratory  c/o's or need for noct saba. Also denies any obvious fluctuation of symptoms with weather or environmental changes or other aggravating or alleviating factors except as outlined above   No unusual exposure hx or h/o childhood pna   or knowledge of premature birth.  Current Allergies, Complete Past Medical History, Past Surgical History, Family History, and Social History were reviewed in [employer] record.  ROS  The following are not active complaints unless bolded Hoarseness, sore throat, dysphagia, dental problems, itching, sneezing,  nasal congestion or discharge of excess mucus or purulent secretions, ear ache,   fever, chills, sweats, unintended wt loss or wt gain, classically pleuritic or exertional cp,  orthopnea pnd or arm/hand swelling  or leg swelling, presyncope, palpitations, abdominal pain, anorexia, nausea, vomiting, diarrhea  or change in bowel habits or change in bladder habits, change in stools or change in urine, dysuria, hematuria,  rash, arthralgias, visual complaints, headache, numbness, weakness or ataxia or problems with walking or coordination,  change in mood or  memory.  Current Meds  Medication Sig  . Ascorbic Acid (VITAMIN C WITH ROSE HIPS) 500 MG tablet 1000/500 mg 1 by mouth twice daily  . BETAINE PO Take 521 mg by mouth 2 (two) times daily.  . Cholecalciferol 25 MCG (1000 UT) capsule Take 2,000 Units by mouth daily.  . clomiPHENE (CLOMID) 50 MG tablet 1/4 tab daily  . DHEA 25 MG CAPS Take 50 mg by mouth daily.  [place] HYDROcodone-acetaminophen (NORCO/VICODIN) 5-325 MG tablet Take 1 tablet by mouth every 6 (six) hours as needed for moderate pain.  . IRON PO 1,100 mg. 2 by mouth in the am and 1 by mouth in the pm  . Magnesium 250 MG TABS Take 1 tablet by mouth daily.  [place] MELATONIN PO Take by mouth. 1-3 mg as needed  . Turmeric 500  MG CAPS Take 1 capsule by mouth daily.  [place] UNABLE TO FIND Med Name: Russion choice immune, 2 by mouth in the am, 2 by mouth in the pm  . UNABLE TO FIND Med Name: Dicalcium Malate 150/250 mg, 1 by mouth twice daily  . UNABLE TO FIND Med Name: Iodoral 12.5 mg 1 by mouth daily  . UNABLE TO FIND Med Name: Itis Care 1 gram 1 by mouth twice daily  . UNABLE TO FIND Med Name: Nutrient 950, 3 by mouth in the am, 2 by mouth in the pm  . UNABLE TO FIND Med Name: Pyridoxal 5-Phosphate 50 mg, 2 by mouth in the pm  . UNABLE TO FIND Med Name: YES EFA's 740 mg 1 by mouth twice daily  . vitamin A 10000 UNIT capsule Take 10,000 Units by mouth daily.                  Past Medical History:  Diagnosis Date  . Blood transfusion without reported diagnosis        Objective:     Wt Readings from Last 3 Encounters:  06/12/20 165 lb 12.8 oz (75.2 kg)  01/06/20 163 lb (73.9 kg)  11/09/19 163 lb (73.9 kg)      Vital signs reviewed  06/12/2020  - Note at rest 02 sats  95% on RA   General appearance:    amb somber wm nad   HEENT : pt wearing mask not removed for exam due to covid -19 concerns.    NECK :  without JVD/Nodes/TM/ nl carotid upstrokes bilaterally   LUNGS: no acc muscle use,  Nl contour chest which is clear to A and P bilaterally without cough on insp or exp maneuvers   CV:  RRR  no s3 or murmur or increase in P2, and no edema   ABD:  soft and nontender with nl inspiratory excursion in the supine position. No bruits or organomegaly appreciated, bowel sounds nl  MS:  Nl gait/ ext warm without deformities, calf tenderness, cyanosis or clubbing No obvious joint restrictions   SKIN: warm and dry without lesions    NEURO:  alert, approp, nl sensorium with  no motor or cerebellar deficits apparent.            CXR PA and Lateral:   06/12/2020 :    I personally reviewed images and agree with radiology impression as follows:    Lungs are adequately inflated with flattening of the  hemidiaphragms on the lateral film.    Labs ordered/ reviewed:      Chemistry      Component Value Date/Time   NA 141 06/12/2020 0930   NA  140 10/15/2017 1517   K 4.7 06/12/2020 0930   CL 103 06/12/2020 0930   CO2 33 (H) 06/12/2020 0930   BUN 20 06/12/2020 0930   BUN 18 10/15/2017 1517   CREATININE 1.06 06/12/2020 0930   CREATININE 0.93 12/04/2018 0804      Component Value Date/Time   CALCIUM 10.1 06/12/2020 0930   ALKPHOS 101 10/15/2017 1517   AST 34 12/04/2018 0804   ALT 28 12/04/2018 0804   BILITOT 0.8 12/04/2018 0804   BILITOT 0.3 10/15/2017 1517        Lab Results  Component Value Date   WBC 7.5 06/12/2020   HGB 16.2 06/12/2020   HCT 48.1 06/12/2020   MCV 89.9 06/12/2020   PLT 192.0 06/12/2020     Lab Results  Component Value Date   DDIMER 0.72 (H) 06/12/2020      Lab Results  Component Value Date   TSH 3.22 06/12/2020     Lab Results  Component Value Date   PROBNP 20.0 06/12/2020          Assessment

## 2020-06-12 NOTE — Assessment & Plan Note (Addendum)
Onset fall 2019  X 6 months with background of childhood asthma - 06/22/2018   Walked RA  2 laps @  approx 274ft each @ very  fast pace  stopped due to end of study = "exhausted", no desat or obvious sob.  - 06/12/2020 rec cpst with spirometry before and after   Labs ok though note HC03 is consistently on high side ? Why  D dimer nl - while a normal  or high normal value (seen commonly in the elderly or chronically ill)  may miss small peripheral pe, the clot burden with sob is moderately high and the d dimer  has a very high neg pred value if used in this setting.    cxr is consistently hyperinflated and this needs eval with PFTs plus needs spirometry before and after ex to r/o EIA though this seems very unlikely   Did not complete the w/u from prior but now that covid restrictions have been removed need to sort out mech of ex intol so rec regular submax treadmill x at leat 2 weeks then cpst as above with full pfts as well   Discussed in detail all the  indications, usual  risks and alternatives  relative to the benefits with patient who agrees to proceed with w/u as outlined.            Each maintenance medication was reviewed in detail including emphasizing most importantly the difference between maintenance and prns and under what circumstances the prns are to be triggered using an action plan format where appropriate.  Total time for H and P, chart review, counseling,   and generating customized AVS unique to this office visit / same day charting = 33 min                   Each maintenance medication was reviewed in detail including emphasizing most importantly the difference between maintenance and prns and under what circumstances the prns are to be triggered using an action plan format where appropriate.  Total time for H and P, chart review, counseling,  and generating customized AVS unique to this office visit to re establish/ same day charting = 31 min

## 2020-06-13 ENCOUNTER — Encounter: Payer: Self-pay | Admitting: Internal Medicine

## 2020-06-16 NOTE — Progress Notes (Signed)
I advised him of the results. He wants to know if the PFT will determine if he will still need to have the CPST. Please advise, thanks

## 2020-06-19 ENCOUNTER — Encounter: Payer: Self-pay | Admitting: *Deleted

## 2020-06-29 ENCOUNTER — Encounter (HOSPITAL_COMMUNITY): Payer: PPO

## 2020-07-06 ENCOUNTER — Other Ambulatory Visit: Payer: Self-pay

## 2020-07-06 ENCOUNTER — Ambulatory Visit (HOSPITAL_COMMUNITY): Payer: PPO | Attending: Pulmonary Disease

## 2020-07-06 DIAGNOSIS — R0600 Dyspnea, unspecified: Secondary | ICD-10-CM | POA: Diagnosis not present

## 2020-07-06 DIAGNOSIS — R0609 Other forms of dyspnea: Secondary | ICD-10-CM

## 2020-07-11 LAB — D-DIMER, QUANTITATIVE: D-Dimer, Quant: 0.72 mcg/mL FEU — ABNORMAL HIGH (ref <0.50)

## 2020-07-11 LAB — ALPHA-1 ANTITRYPSIN PHENOTYPE: A-1 Antitrypsin, Ser: 120 mg/dL (ref 83–199)

## 2020-08-28 ENCOUNTER — Other Ambulatory Visit: Payer: Self-pay

## 2020-08-28 ENCOUNTER — Ambulatory Visit: Payer: PPO | Admitting: Internal Medicine

## 2020-08-28 ENCOUNTER — Encounter: Payer: Self-pay | Admitting: Internal Medicine

## 2020-08-28 DIAGNOSIS — R0609 Other forms of dyspnea: Secondary | ICD-10-CM

## 2020-08-28 DIAGNOSIS — R0600 Dyspnea, unspecified: Secondary | ICD-10-CM | POA: Diagnosis not present

## 2020-08-28 MED ORDER — ALBUTEROL SULFATE HFA 108 (90 BASE) MCG/ACT IN AERS: 1.0000 | INHALATION_SPRAY | RESPIRATORY_TRACT | 1 refills | Status: DC | PRN

## 2020-08-28 NOTE — Progress Notes (Signed)
Jason Blevins, male    DOB: [birth year]     MRN: [id number]   Brief patient profile:  59   yowm never smoker music teacher/ college communications teacher at [employer] had asthma as child tested in [place]  Pos dogs/cats/grasses on allergy shots ? Benefit around 1962 grew out of it in 1968 age 70  And no problems and no need for inhalers but since then colds tend  to go into chest and need abx from twice a year down once every 2 years and "fine" in between until late 2019 indolent onset worsening sob then acutex 6 m   typical uri much worse than usual  with chest tightness eval by [name] 12/29/17 rx  augmentin which resolved URI acute symptoms but never back to baseline ex tol   referred to pulmonary clinic 06/22/2018 by Dr [name]     History of Present Illness  06/22/2018  Pulmonary/ 1st office eval/Jenner Rosier  Chief Complaint  Patient presents with   Pulmonary Consult    Pt c/o DOE x 6 months. He had to stop playing basketball and had to stop due to DOE and he also gets SOB running with his dog.   Dyspnea:  MMRC1 = can walk nl pace, flat grade, can't hurry or go uphills or steps s sob Cough: gone now completely  Sleep: lie flat ok SABA use: no rec  Please remember to go to the lab  Nl / cxr "copd" In the meantime, resume as much regular activity as you can so we can perform an accurate CPST and return for pfts > never done    06/12/2020  f/u ov/Kelda Azad re: doe no better no worse ex tol, no fluctuation in doe or rest/ noct symptoms  Chief Complaint  Patient presents with   Follow-up    Pt c/o "no stamina"   Dyspnea:  No change ex tol > can only do eliptical slowly x 20 min  And slowed by heart pounding not sob Cough: none  Sleeping: electric bed minimal elevation no resp symptoms and no heart pounding  SABA use: none  02: none  Covid status:   vax 3  Rec To get the most out of exercise, you need to be continuously aware that you are short of breath, but never out of breath, for at least 30  minutes daily.   Make sure you check your oxygen saturations at highest level of activity (pulse oximeter will give you both your heart rate and your 02 saturation and we want the saturation over 90% and the pulse up to  130 )  Please remember to go to the lab and x-ray department    08/28/2020  f/u ov/Joe Gee re: doe with nl cpst x for 12% drop in FEV1  Chief Complaint  Patient presents with   Follow-up    Patient is here for a follow up for Stress test and to go over results.  Dyspnea:  swimming/ [place] track [place] [facility] x 40 min twice weekly /   Cough: none  Sleeping: well hob slt propped up  SABA use: none 02: none      No obvious day to day or daytime variability or assoc excess/ purulent sputum or mucus plugs or hemoptysis or cp or chest tightness, subjective wheeze or overt sinus or hb symptoms.   Sleeping  without nocturnal  or early am exacerbation  of respiratory  c/o's or need for noct saba. Also denies any obvious fluctuation of symptoms with weather  or environmental changes or other aggravating or alleviating factors except as outlined above   No unusual exposure hx or h/o childhood pna/ asthma or knowledge of premature birth.  Current Allergies, Complete Past Medical History, Past Surgical History, Family History, and Social History were reviewed in [facility] record.  ROS  The following are not active complaints unless bolded Hoarseness, sore throat, dysphagia, dental problems, itching, sneezing,  nasal congestion or discharge of excess mucus or purulent secretions, ear ache,   fever, chills, sweats, unintended wt loss or wt gain, classically pleuritic or exertional cp,  orthopnea pnd or arm/hand swelling  or leg swelling, presyncope, palpitations, abdominal pain, anorexia, nausea, vomiting, diarrhea  or change in bowel habits or change in bladder habits, change in stools or change in urine, dysuria, hematuria,  rash, arthralgias, visual complaints, headache,  numbness, weakness or ataxia or problems with walking or coordination,  change in mood or  memory. Fatigue, problems with balance esp if stands up too quickly and sometimes with ex         Current Meds  Medication Sig   Ascorbic Acid (VITAMIN C WITH ROSE HIPS) 500 MG tablet 1000/500 mg 1 by mouth twice daily   BETAINE PO Take 521 mg by mouth 2 (two) times daily.   Cholecalciferol 25 MCG (1000 UT) capsule Take 2,000 Units by mouth daily.   clomiPHENE (CLOMID) 50 MG tablet 1/4 tab daily   DHEA 25 MG CAPS Take 50 mg by mouth daily.   HYDROcodone-acetaminophen (NORCO/VICODIN) 5-325 MG tablet Take 1 tablet by mouth every 6 (six) hours as needed for moderate pain.   IRON PO 1,100 mg. 2 by mouth in the am and 1 by mouth in the pm   Magnesium 250 MG TABS Take 1 tablet by mouth daily.   MELATONIN PO Take by mouth. 1-3 mg as needed   Turmeric 500 MG CAPS Take 1 capsule by mouth daily.   UNABLE TO FIND Med Name: Russion choice immune, 2 by mouth in the am, 2 by mouth in the pm   UNABLE TO FIND Med Name: Dicalcium Malate 150/250 mg, 1 by mouth twice daily   UNABLE TO FIND Med Name: Iodoral 12.5 mg 1 by mouth daily   UNABLE TO FIND Med Name: Itis Care 1 gram 1 by mouth twice daily   UNABLE TO FIND Med Name: Nutrient 950, 3 by mouth in the am, 2 by mouth in the pm   UNABLE TO FIND Med Name: Pyridoxal 5-Phosphate 50 mg, 2 by mouth in the pm   UNABLE TO FIND Med Name: YES EFA's 740 mg 1 by mouth twice daily   vitamin A 10000 UNIT capsule Take 10,000 Units by mouth daily.                    Past Medical History:  Diagnosis Date   Blood transfusion without reported diagnosis        Objective:     08/28/2020       164   06/12/20 165 lb 12.8 oz (75.2 kg)  01/06/20 163 lb (73.9 kg)  11/09/19 163 lb (73.9 kg)      Vital signs reviewed  08/28/2020  - Note at rest 02 sats  95% on RA   General appearance:    amb wm nad   HEENT : pt wearing mask not removed for exam due to covid -19  concerns.    NECK :  without JVD/Nodes/TM/ nl carotid upstrokes bilaterally  LUNGS: no acc muscle use,  Nl contour chest which is clear to A and P bilaterally without cough on insp or exp maneuvers   CV:  RRR  no s3 or murmur or increase in P2, and no edema   ABD:  soft and nontender with nl inspiratory excursion in the supine position. No bruits or organomegaly appreciated, bowel sounds nl  MS:  Nl gait/ ext warm without deformities, calf tenderness, cyanosis or clubbing No obvious joint restrictions   SKIN: warm and dry without lesions    NEURO:  alert, approp, nl sensorium with  no motor or cerebellar deficits apparent.                    Assessment

## 2020-08-28 NOTE — Patient Instructions (Addendum)
Treadmill x 30 min x 3 x per week at level where short of breath not out of breath is a minimum of what is required to recondition you   If you are not happy then next step is albuterol x one puff x 15 min before treadmill, increase to 2 puffs if needed    If you are satisfied with your treatment plan,  let your doctor know and he/she can either refill your medications or you can return here when your prescription runs out.     If in any way you are not 100% satisfied,  please tell us.  If 100% better, tell your friends!  Pulmonary follow up is as needed  +

## 2020-08-28 NOTE — Assessment & Plan Note (Signed)
Onset fall 2019  X 6 months with background of childhood asthma - 06/22/2018   Walked RA  2 laps @  approx 239f each @ very  fast pace  stopped due to end of study = "exhausted", no desat or obvious sob.  - 06/12/20  Alpha one AT  MM  Level 120  - 07/06/20  CPST  Nl study except for  fev1 down 12% p ex   He may have mild EIA but doubt that's what's limiting him from regular ex or affecting his balance and fatigue  Reviewed need for min of 30 min aerobics 3 x weekly and after establishes baseline then try proair 1-2 puffs prior to see if any change in symptoms and if not then no need to use this   - The proper method of use, as well as anticipated side effects, of a metered-dose inhaler were discussed and demonstrated to the patient using teach back method   F/u here can be prn         Each maintenance medication was reviewed in detail including emphasizing most importantly the difference between maintenance and prns and under what circumstances the prns are to be triggered using an action plan format where appropriate.  Total time for H and P, chart review, counseling, reviewing hfa  device(s) and generating customized AVS unique to this summary final  office visit / same day charting  > 30 min

## 2020-09-05 DIAGNOSIS — H5203 Hypermetropia, bilateral: Secondary | ICD-10-CM | POA: Diagnosis not present

## 2020-09-05 DIAGNOSIS — H25013 Cortical age-related cataract, bilateral: Secondary | ICD-10-CM | POA: Diagnosis not present

## 2020-10-18 DIAGNOSIS — H25013 Cortical age-related cataract, bilateral: Secondary | ICD-10-CM | POA: Diagnosis not present

## 2020-10-18 DIAGNOSIS — H2513 Age-related nuclear cataract, bilateral: Secondary | ICD-10-CM | POA: Diagnosis not present

## 2020-11-14 DIAGNOSIS — H25012 Cortical age-related cataract, left eye: Secondary | ICD-10-CM | POA: Diagnosis not present

## 2020-11-14 DIAGNOSIS — H2512 Age-related nuclear cataract, left eye: Secondary | ICD-10-CM | POA: Diagnosis not present

## 2020-11-14 DIAGNOSIS — H25812 Combined forms of age-related cataract, left eye: Secondary | ICD-10-CM | POA: Diagnosis not present

## 2021-01-09 DIAGNOSIS — U071 COVID-19: Secondary | ICD-10-CM | POA: Diagnosis not present

## 2021-02-06 DIAGNOSIS — M545 Low back pain, unspecified: Secondary | ICD-10-CM | POA: Diagnosis not present

## 2021-02-06 DIAGNOSIS — Z79899 Other long term (current) drug therapy: Secondary | ICD-10-CM | POA: Diagnosis not present

## 2021-02-06 DIAGNOSIS — Z136 Encounter for screening for cardiovascular disorders: Secondary | ICD-10-CM | POA: Diagnosis not present

## 2021-02-06 DIAGNOSIS — Z13220 Encounter for screening for lipoid disorders: Secondary | ICD-10-CM | POA: Diagnosis not present

## 2021-02-06 DIAGNOSIS — Z0000 Encounter for general adult medical examination without abnormal findings: Secondary | ICD-10-CM | POA: Diagnosis not present

## 2021-03-12 DIAGNOSIS — L578 Other skin changes due to chronic exposure to nonionizing radiation: Secondary | ICD-10-CM | POA: Diagnosis not present

## 2021-03-12 DIAGNOSIS — L821 Other seborrheic keratosis: Secondary | ICD-10-CM | POA: Diagnosis not present

## 2021-03-12 DIAGNOSIS — L810 Postinflammatory hyperpigmentation: Secondary | ICD-10-CM | POA: Diagnosis not present

## 2021-03-12 DIAGNOSIS — D2271 Melanocytic nevi of right lower limb, including hip: Secondary | ICD-10-CM | POA: Diagnosis not present

## 2021-03-12 DIAGNOSIS — Z23 Encounter for immunization: Secondary | ICD-10-CM | POA: Diagnosis not present

## 2021-03-12 DIAGNOSIS — D225 Melanocytic nevi of trunk: Secondary | ICD-10-CM | POA: Diagnosis not present

## 2021-03-12 DIAGNOSIS — Z86018 Personal history of other benign neoplasm: Secondary | ICD-10-CM | POA: Diagnosis not present

## 2021-03-12 DIAGNOSIS — L570 Actinic keratosis: Secondary | ICD-10-CM | POA: Diagnosis not present

## 2021-04-10 DIAGNOSIS — J069 Acute upper respiratory infection, unspecified: Secondary | ICD-10-CM | POA: Diagnosis not present

## 2021-04-10 DIAGNOSIS — H25011 Cortical age-related cataract, right eye: Secondary | ICD-10-CM | POA: Diagnosis not present

## 2021-04-10 DIAGNOSIS — H25811 Combined forms of age-related cataract, right eye: Secondary | ICD-10-CM | POA: Diagnosis not present

## 2021-04-10 DIAGNOSIS — H2511 Age-related nuclear cataract, right eye: Secondary | ICD-10-CM | POA: Diagnosis not present

## 2021-04-25 ENCOUNTER — Other Ambulatory Visit: Payer: Self-pay | Admitting: Family Medicine

## 2021-04-25 ENCOUNTER — Ambulatory Visit
Admission: RE | Admit: 2021-04-25 | Discharge: 2021-04-25 | Disposition: A | Discharge status: Home / Self Care | Payer: PPO | Source: Ambulatory Visit | Attending: Family Medicine | Admitting: Family Medicine

## 2021-04-25 DIAGNOSIS — R059 Cough, unspecified: Secondary | ICD-10-CM | POA: Diagnosis not present

## 2021-04-25 DIAGNOSIS — J449 Chronic obstructive pulmonary disease, unspecified: Secondary | ICD-10-CM | POA: Diagnosis not present

## 2021-04-25 DIAGNOSIS — R052 Subacute cough: Secondary | ICD-10-CM | POA: Diagnosis not present

## 2021-05-14 DIAGNOSIS — L570 Actinic keratosis: Secondary | ICD-10-CM | POA: Diagnosis not present

## 2021-05-14 DIAGNOSIS — D485 Neoplasm of uncertain behavior of skin: Secondary | ICD-10-CM | POA: Diagnosis not present

## 2021-05-14 DIAGNOSIS — C44319 Basal cell carcinoma of skin of other parts of face: Secondary | ICD-10-CM | POA: Diagnosis not present

## 2021-07-18 DIAGNOSIS — C44319 Basal cell carcinoma of skin of other parts of face: Secondary | ICD-10-CM | POA: Diagnosis not present

## 2021-08-22 DIAGNOSIS — H903 Sensorineural hearing loss, bilateral: Secondary | ICD-10-CM | POA: Diagnosis not present

## 2021-09-11 DIAGNOSIS — H903 Sensorineural hearing loss, bilateral: Secondary | ICD-10-CM | POA: Diagnosis not present

## 2021-09-19 DIAGNOSIS — H01001 Unspecified blepharitis right upper eyelid: Secondary | ICD-10-CM | POA: Diagnosis not present

## 2021-09-19 DIAGNOSIS — H01004 Unspecified blepharitis left upper eyelid: Secondary | ICD-10-CM | POA: Diagnosis not present

## 2022-01-02 DIAGNOSIS — S0502XA Injury of conjunctiva and corneal abrasion without foreign body, left eye, initial encounter: Secondary | ICD-10-CM | POA: Diagnosis not present

## 2022-01-03 DIAGNOSIS — H18832 Recurrent erosion of cornea, left eye: Secondary | ICD-10-CM | POA: Diagnosis not present

## 2022-01-04 DIAGNOSIS — H18831 Recurrent erosion of cornea, right eye: Secondary | ICD-10-CM | POA: Diagnosis not present

## 2022-01-14 DIAGNOSIS — H18832 Recurrent erosion of cornea, left eye: Secondary | ICD-10-CM | POA: Diagnosis not present

## 2022-01-17 DIAGNOSIS — M545 Low back pain, unspecified: Secondary | ICD-10-CM | POA: Diagnosis not present

## 2022-01-30 DIAGNOSIS — H18832 Recurrent erosion of cornea, left eye: Secondary | ICD-10-CM | POA: Diagnosis not present

## 2022-02-15 DIAGNOSIS — Z79899 Other long term (current) drug therapy: Secondary | ICD-10-CM | POA: Diagnosis not present

## 2022-02-15 DIAGNOSIS — Z0000 Encounter for general adult medical examination without abnormal findings: Secondary | ICD-10-CM | POA: Diagnosis not present

## 2022-02-15 DIAGNOSIS — M79651 Pain in right thigh: Secondary | ICD-10-CM | POA: Diagnosis not present

## 2022-02-15 DIAGNOSIS — M545 Low back pain, unspecified: Secondary | ICD-10-CM | POA: Diagnosis not present

## 2022-02-15 DIAGNOSIS — E7800 Pure hypercholesterolemia, unspecified: Secondary | ICD-10-CM | POA: Diagnosis not present

## 2022-02-15 DIAGNOSIS — E611 Iron deficiency: Secondary | ICD-10-CM | POA: Diagnosis not present

## 2022-03-11 DIAGNOSIS — R6889 Other general symptoms and signs: Secondary | ICD-10-CM | POA: Diagnosis not present

## 2022-03-11 DIAGNOSIS — M79604 Pain in right leg: Secondary | ICD-10-CM | POA: Diagnosis not present

## 2022-03-18 DIAGNOSIS — D225 Melanocytic nevi of trunk: Secondary | ICD-10-CM | POA: Diagnosis not present

## 2022-03-18 DIAGNOSIS — Z86018 Personal history of other benign neoplasm: Secondary | ICD-10-CM | POA: Diagnosis not present

## 2022-03-18 DIAGNOSIS — L570 Actinic keratosis: Secondary | ICD-10-CM | POA: Diagnosis not present

## 2022-03-18 DIAGNOSIS — D485 Neoplasm of uncertain behavior of skin: Secondary | ICD-10-CM | POA: Diagnosis not present

## 2022-03-18 DIAGNOSIS — L821 Other seborrheic keratosis: Secondary | ICD-10-CM | POA: Diagnosis not present

## 2022-03-18 DIAGNOSIS — C44519 Basal cell carcinoma of skin of other part of trunk: Secondary | ICD-10-CM | POA: Diagnosis not present

## 2022-03-18 DIAGNOSIS — D2271 Melanocytic nevi of right lower limb, including hip: Secondary | ICD-10-CM | POA: Diagnosis not present

## 2022-03-18 DIAGNOSIS — L578 Other skin changes due to chronic exposure to nonionizing radiation: Secondary | ICD-10-CM | POA: Diagnosis not present

## 2022-03-22 ENCOUNTER — Ambulatory Visit: Payer: HMO | Admitting: Cardiology

## 2022-03-22 ENCOUNTER — Encounter: Payer: Self-pay | Admitting: Cardiology

## 2022-03-22 VITALS — BP 105/65 | HR 65 | Resp 16 | Ht 71.0 in | Wt 162.8 lb

## 2022-03-22 DIAGNOSIS — R531 Weakness: Secondary | ICD-10-CM | POA: Diagnosis not present

## 2022-03-22 DIAGNOSIS — R6889 Other general symptoms and signs: Secondary | ICD-10-CM

## 2022-03-22 DIAGNOSIS — R0609 Other forms of dyspnea: Secondary | ICD-10-CM | POA: Diagnosis not present

## 2022-03-22 NOTE — Progress Notes (Signed)
ID:  Jason Blevins, DOB May 19, 1950, MRN [id number]  PCP:  Patient, No Pcp Per  Cardiologist:  Rex Kras, DO, [facility] (established care 03/22/2022)  REASON FOR CONSULT: Decreased exercise tolerance  REQUESTING PHYSICIAN:  Glenis Smoker, MD [address] [place],  [place] [address]  Chief Complaint  Patient presents with   Establish Care    Shortness of breath, exercise intolerance    HPI  Jason Blevins is a 72 y.o. Caucasian male who presents to the clinic for evaluation of reduced exercise tolerance at the request of Glenis Smoker, *.  No significant past medical history.  Patient was referred to the practice for evaluation of reduced exercise tolerance that has been ongoing for the last 4.5 years.  Patient states that he has been playing basketball for a very long time but has been unable to do so in the last 4.5 years.  Initially it was thought to be secondary to pulmonary etiology and has seen pulmonary medicine and started on inhalers but this has had no impact.  Because of the COVID-19 pandemic additional workup was not sought after at that time and after his most recent visit with PCP he is now referred to cardiology.  He still walks his dog twice a day at a regular pace he is asymptomatic but with overexertion he gets short of breath.  He denies chest pain at rest or with effort related activities.  He is currently not on any prescription medications.  No prior cardiovascular workup in the past   FUNCTIONAL STATUS: Walks his dog twice a day.    ALLERGIES: No Known Allergies  MEDICATION LIST PRIOR TO VISIT: Current Meds  Medication Sig   albuterol (PROAIR HFA) 108 (90 Base) MCG/ACT inhaler Inhale 1-2 puffs into the lungs every 4 (four) hours as needed for wheezing or shortness of breath. 1-2 puffs x 15 minutes before exercise as needed   Ascorbic Acid (VITAMIN C WITH ROSE HIPS) 500 MG tablet 1000/500 mg 1 by mouth twice daily   BETAINE PO Take 521  mg by mouth 2 (two) times daily.   Cholecalciferol 25 MCG (1000 UT) capsule Take 2,000 Units by mouth daily.   DHEA 25 MG CAPS Take 50 mg by mouth daily.   HYDROcodone-acetaminophen (NORCO/VICODIN) 5-325 MG tablet Take 1 tablet by mouth every 6 (six) hours as needed for moderate pain.   IRON PO 1,100 mg. 2 by mouth in the am and 1 by mouth in the pm   Magnesium 250 MG TABS Take 1 tablet by mouth daily.   MELATONIN PO Take by mouth. 1-3 mg as needed   Turmeric 500 MG CAPS Take 1 capsule by mouth daily.   UNABLE TO FIND Med Name: Russion choice immune, 2 by mouth in the am, 2 by mouth in the pm   UNABLE TO FIND Med Name: Dicalcium Malate 150/250 mg, 1 by mouth twice daily   UNABLE TO FIND Med Name: Itis Care 1 gram 1 by mouth twice daily   UNABLE TO FIND Med Name: Pyridoxal 5-Phosphate 50 mg, 2 by mouth in the pm   UNABLE TO FIND Med Name: YES EFA's 740 mg 1 by mouth twice daily   vitamin A 10000 UNIT capsule Take 10,000 Units by mouth daily.     PAST MEDICAL HISTORY: Past Medical History:  Diagnosis Date   Allergy    Anxiety    Asthma    AS CHILD   Blood transfusion without reported diagnosis  Cataract    BEGINNING   Skin cancer    Per [facility] New Patient Packet    PAST SURGICAL HISTORY: Past Surgical History:  Procedure Laterality Date   MOHS SURGERY  1994   Per [facility] New Patient Packet   SPINE SURGERY      FAMILY HISTORY: The patient family history includes Arthritis in his sister; Cancer in his mother; Cancer (age of onset: 33) in his sister; Heart disease in his maternal grandfather.  SOCIAL HISTORY:  The patient  reports that he has never smoked. He has never used smokeless tobacco. He reports current alcohol use. He reports that he does not use drugs.  REVIEW OF SYSTEMS: Review of Systems  Cardiovascular:  Positive for dyspnea on exertion (chronic - not progressive). Negative for chest pain, claudication, irregular heartbeat, leg swelling, near-syncope, orthopnea,  palpitations, paroxysmal nocturnal dyspnea and syncope.  Respiratory:  Positive for shortness of breath.   Hematologic/Lymphatic: Negative for bleeding problem.  Musculoskeletal:  Negative for muscle cramps and myalgias.  Neurological:  Negative for dizziness and light-headedness.    PHYSICAL EXAM:    03/22/2022   11:13 AM 08/28/2020    8:58 AM 06/12/2020    8:44 AM  Vitals with BMI  Height 5' 11"$  5' 11"$  5' 11"$   Weight 162 lbs 13 oz 164 lbs 3 oz 165 lbs 13 oz  BMI 22.72 [id number]  Systolic 123456 [id number]  Diastolic 65 64 76  Pulse 65 56 63    Physical Exam  Constitutional: No distress.  Age appropriate, hemodynamically stable.   Neck: No JVD present.  Cardiovascular: Normal rate, regular rhythm, S1 normal, S2 normal, intact distal pulses and normal pulses. Exam reveals no gallop, no S3 and no S4.  No murmur heard. Pulmonary/Chest: Effort normal and breath sounds normal. No stridor. He has no wheezes. He has no rales.  Abdominal: Soft. Bowel sounds are normal. He exhibits no distension. There is no abdominal tenderness.  Musculoskeletal:        General: No edema.     Cervical back: Neck supple.  Neurological: He is alert and oriented to person, place, and time. He has intact cranial nerves (2-12).  Skin: Skin is warm and moist.    CARDIAC DATABASE: EKG: 03/22/2022: Sinus bradycardia, 57 bpm, left axis, left anterior fascicular block, without underlying injury pattern  Today before but  Echocardiogram: No results found for this or any previous visit from the past 1095 days.    Stress Testing: No results found for this or any previous visit from the past 1095 days.   Heart Catheterization: None  LABORATORY DATA:  Lipid Panel w/reflex Reviewed date:02/15/2022 03:40:38 PM Interpretation:LDL 112 Performing Lab: Notes/Report: Testing Performed at: [facility], [address], [place], [place] [address]  Cholesterol 203 <200 mg/dL    CHOL/HDL 2.6 2.0-4.0  Ratio    HDLD 79 30-70 mg/dL Values below 40 mg/dL indicate increased risk factor  Triglyceride 62 0-199 mg/dL    NHDL 124 0-129 mg/dL Range dependent upon risk factors.  LDL Chol Calc (NIH) 112 0-99 mg/dL     Comp Metabolic Panel Reviewed 0000000 03:40:46 PM Interpretation:normal Performing Lab: Notes/Report: Testing Performed at: [facility], [address], [address], [place], [place] [address]  Glucose 81 70-99 mg/dL    BUN 23 6-26 mg/dL    Creatinine 0.98 0.60-1.30 mg/dl    [id number] 81 >60 calc In accordance with recommendations from NKF-ASN Task Force, [facility] has updated its eGFR calc to the 2021  CKD-EDI equation that estimates kidney function without a race variable;Stage 1 > 90 ML/Min plus Albuminuria;Stage 2 60-89 ML/MIN;Stage 3 30-59 ML/MIN;Stage 4 15-29 ML/MIN;Stage 5 <15 ML/MIN  Sodium 139 136-145 mmol/L    Potassium 4.8 3.5-5.5 mmol/L    Chloride 103 98-107 mmol/L    CO2 31 22-32 mmol/L    Anion Gap 9.7 6.0-20.0 mmol/L    Calcium 9.6 8.6-10.3 mg/dL    CA-corrected 9.51 8.60-10.30 mg/dL    Protein, Total 6.4 6.0-8.3 g/dL    Albumin 4.1 3.4-4.8 g/dL    TBIL 0.7 0.3-1.0 mg/dL    ALP 77 38-126 U/L    AST 37 0-39 U/L    ALT 35 0-52 U/L    CBC without Diff Reviewed date:02/15/2022 03:40:54 PM Interpretation:normal Performing Lab: Notes/Report: Testing Performed at: [facility], [address], [address], [place], [place] [address]  WBC 7.0 4.0-11.0 K/ul    RBC 5.01 4.20-5.80 M/uL    HGB 14.9 13.0-17.0 g/dL    HCT 45.2 39.0-52.0 %    MCV 90.3 80.0-94.0 fL    MCH 29.7 27.0-33.0 pg    MCHC 32.9 32.0-36.0 g/dL    RDW 13.5 11.5-15.5 %    PLT 222 150-400 K/uL    Iron, Serum Reviewed date:02/19/2022 07:39:07 AM Interpretation:normal Performing Lab: Notes/Report: same day addon Testing Performed at: [facility], [address], [address], [place],  [address]  Iron, serum 126 50-212 ug/dL    Magnesium, Serum [phone]) Reviewed date:02/19/2022 11:40:16  AM Interpretation:2.3 Performing Lab: Notes/Report: same day addon Testing performed at: [BN] [facility], [address], [place], [place], [address], Phone: [phone], Laboratory Director: Rush Farmer, MD  Magnesium 2.3 1.6-2.3 mg/dL     IMPRESSION:    ICD-10-CM   1. Dyspnea on exertion  R06.09 EKG 12-Lead    PCV ECHOCARDIOGRAM COMPLETE    PCV CARDIAC STRESS TEST    CT CARDIAC SCORING ([facility] LOCATIONS ONLY)    2. Worsening functional endurance  R68.89 PCV ECHOCARDIOGRAM COMPLETE    PCV CARDIAC STRESS TEST    CT CARDIAC SCORING ([facility] LOCATIONS ONLY)       RECOMMENDATIONS: Jason Blevins is a 72 y.o. Caucasian male who has no significant past medical history presents today for evaluation of dyspnea on exertion and reduced exercise tolerance.  The symptoms have been ongoing for the last 4.5 years after voicing these concerns at his recent PCPs visit he is referred to cardiology for further evaluation and management.  EKG shows sinus rhythm without underlying ischemia or injury pattern.  About 4.5 years ago he was playing basketball on a regular basis.  As he started to slow down he started noticing that he had to rest and/or take a nap after each game and eventually stopped playing due to decreased intolerance.  Patient wonders if there is an organic reason behind this versus aging process.  Shared decision was to proceed with an echocardiogram, coronary calcium score, and GXT to evaluate for structural heart disease, atherosclerotic process, and exercise-induced ischemia respectively.  Further recommendations to follow  Outside labs independently reviewed and noted above for further reference.   FINAL MEDICATION LIST END OF ENCOUNTER: No orders of the defined types were placed in this encounter.   Medications Discontinued During This Encounter  Medication Reason   clomiPHENE (CLOMID) 50 MG tablet    UNABLE TO FIND    UNABLE TO FIND      Current Outpatient  Medications:    albuterol (PROAIR HFA) 108 (90 Base) MCG/ACT inhaler, Inhale 1-2  puffs into the lungs every 4 (four) hours as needed for wheezing or shortness of breath. 1-2 puffs x 15 minutes before exercise as needed, Disp: 1 each, Rfl: 1   Ascorbic Acid (VITAMIN C WITH ROSE HIPS) 500 MG tablet, 1000/500 mg 1 by mouth twice daily, Disp: , Rfl:    BETAINE PO, Take 521 mg by mouth 2 (two) times daily., Disp: , Rfl:    Cholecalciferol 25 MCG (1000 UT) capsule, Take 2,000 Units by mouth daily., Disp: , Rfl:    DHEA 25 MG CAPS, Take 50 mg by mouth daily., Disp: , Rfl:    HYDROcodone-acetaminophen (NORCO/VICODIN) 5-325 MG tablet, Take 1 tablet by mouth every 6 (six) hours as needed for moderate pain., Disp: 15 tablet, Rfl: 0   IRON PO, 1,100 mg. 2 by mouth in the am and 1 by mouth in the pm, Disp: , Rfl:    Magnesium 250 MG TABS, Take 1 tablet by mouth daily., Disp: , Rfl:    MELATONIN PO, Take by mouth. 1-3 mg as needed, Disp: , Rfl:    Turmeric 500 MG CAPS, Take 1 capsule by mouth daily., Disp: , Rfl:    UNABLE TO FIND, Med Name: Russion choice immune, 2 by mouth in the am, 2 by mouth in the pm, Disp: , Rfl:    UNABLE TO FIND, Med Name: Dicalcium Malate 150/250 mg, 1 by mouth twice daily, Disp: , Rfl:    UNABLE TO FIND, Med Name: [place] 1 gram 1 by mouth twice daily, Disp: , Rfl:    UNABLE TO FIND, Med Name: Pyridoxal 5-Phosphate 50 mg, 2 by mouth in the pm, Disp: , Rfl:    UNABLE TO FIND, Med Name: YES EFA's 740 mg 1 by mouth twice daily, Disp: , Rfl:    vitamin A 10000 UNIT capsule, Take 10,000 Units by mouth daily., Disp: , Rfl:   Orders Placed This Encounter  Procedures   CT CARDIAC SCORING ([facility] LOCATIONS ONLY)   PCV CARDIAC STRESS TEST   EKG 12-Lead   PCV ECHOCARDIOGRAM COMPLETE    There are no Patient Instructions on file for this visit.   --Continue cardiac medications as reconciled in final medication list. --Return in about 5 weeks (around 04/26/2022) for Follow up, Review test  results. or sooner if needed. --Continue follow-up with your primary care physician regarding the management of your other chronic comorbid conditions.  Patient's questions and concerns were addressed to his satisfaction. He voices understanding of the instructions provided during this encounter.   This note was created using a voice recognition software as a result there may be grammatical errors inadvertently enclosed that do not reflect the nature of this encounter. Every attempt is made to correct such errors.  Rex Kras, [place], [facility]  Pager: [phone] Office: [phone]

## 2022-04-02 ENCOUNTER — Ambulatory Visit: Payer: HMO

## 2022-04-02 DIAGNOSIS — R0609 Other forms of dyspnea: Secondary | ICD-10-CM

## 2022-04-02 DIAGNOSIS — R6889 Other general symptoms and signs: Secondary | ICD-10-CM

## 2022-04-05 ENCOUNTER — Ambulatory Visit: Payer: HMO

## 2022-04-05 DIAGNOSIS — R0609 Other forms of dyspnea: Secondary | ICD-10-CM | POA: Diagnosis not present

## 2022-04-05 DIAGNOSIS — R6889 Other general symptoms and signs: Secondary | ICD-10-CM

## 2022-04-11 NOTE — Progress Notes (Signed)
Gave patient results, he acknowledged understanding. I reminded him of the coronary calcium score and he said he has already had this done, I do not see this in his chart. I am trying to get him to give me the location he had it done and I will call them to get the results

## 2022-04-29 ENCOUNTER — Ambulatory Visit: Payer: HMO | Admitting: Cardiology

## 2022-04-29 ENCOUNTER — Encounter: Payer: Self-pay | Admitting: Cardiology

## 2022-04-29 VITALS — BP 124/67 | HR 64 | Resp 16 | Ht 71.0 in | Wt 162.8 lb

## 2022-04-29 DIAGNOSIS — R0609 Other forms of dyspnea: Secondary | ICD-10-CM

## 2022-04-29 DIAGNOSIS — R6889 Other general symptoms and signs: Secondary | ICD-10-CM | POA: Diagnosis not present

## 2022-04-29 NOTE — Progress Notes (Signed)
ID:  Jason Blevins, DOB 12-10-1950, MRN [id number]  PCP:  Glenis Smoker, MD  Cardiologist:  Rex Kras, DO, [facility] (established care 03/22/2022)  Date: 04/29/22 Last Office Visit: 03/22/2022  Chief Complaint  Patient presents with   Follow-up    Reevaluation of reduced exercise tolerance and discuss test results    HPI  Jason Blevins is a 72 y.o. Caucasian male who presents to the clinic for evaluation of reduced exercise tolerance at the request of Kenyia Wambolt, DO.  No significant past medical history.  Referred to the practice back in February 2024 for evaluation of reduced exercise tolerance that has been ongoing for the last 4.5 years.  In the past he was playing basketball regularly but not anymore.  At the last office visit shared decision was to proceed with echo, stress test, coronary calcium score.  Results reviewed with him in great detail and noted above for further reference.  His reduced exercise intolerance continues.  He feels tired and fatigue.  He has establish care with pulmonary medicine and started on inhalers.  He still walks his dog twice a day at a regular pace.  FUNCTIONAL STATUS: Walks his dog twice a day.    ALLERGIES: No Known Allergies  MEDICATION LIST PRIOR TO VISIT: Current Meds  Medication Sig   albuterol (PROAIR HFA) 108 (90 Base) MCG/ACT inhaler Inhale 1-2 puffs into the lungs every 4 (four) hours as needed for wheezing or shortness of breath. 1-2 puffs x 15 minutes before exercise as needed   Ascorbic Acid (VITAMIN C WITH ROSE HIPS) 500 MG tablet 1000/500 mg 1 by mouth twice daily   BETAINE PO Take 521 mg by mouth 2 (two) times daily.   Cholecalciferol 25 MCG (1000 UT) capsule Take 2,000 Units by mouth daily.   DHEA 25 MG CAPS Take 50 mg by mouth daily.   HYDROcodone-acetaminophen (NORCO/VICODIN) 5-325 MG tablet Take 1 tablet by mouth every 6 (six) hours as needed for moderate pain.   IRON PO 1,100 mg. 2 by mouth in the am and 1 by  mouth in the pm   Magnesium 250 MG TABS Take 1 tablet by mouth daily.   MELATONIN PO Take by mouth. 1-3 mg as needed   UNABLE TO FIND Med Name: Russion choice immune, 2 by mouth in the am, 2 by mouth in the pm   UNABLE TO FIND Med Name: Dicalcium Malate 150/250 mg, 1 by mouth twice daily   UNABLE TO FIND Med Name: Itis Care 1 gram 1 by mouth twice daily   UNABLE TO FIND Med Name: Pyridoxal 5-Phosphate 50 mg, 2 by mouth in the pm   UNABLE TO FIND Med Name: YES EFA's 740 mg 1 by mouth twice daily   vitamin A 10000 UNIT capsule Take 10,000 Units by mouth daily.     PAST MEDICAL HISTORY: Past Medical History:  Diagnosis Date   Allergy    Anxiety    Asthma    AS CHILD   Blood transfusion without reported diagnosis    Cataract    BEGINNING   Skin cancer    Per [facility] New Patient Packet    PAST SURGICAL HISTORY: Past Surgical History:  Procedure Laterality Date   MOHS SURGERY  1994   Per [facility] New Patient Packet   SPINE SURGERY      FAMILY HISTORY: The patient family history includes Arthritis in his sister; Cancer in his mother; Cancer (age of onset: 96) in his sister; Heart  disease in his maternal grandfather.  SOCIAL HISTORY:  The patient  reports that he has never smoked. He has never used smokeless tobacco. He reports current alcohol use. He reports that he does not use drugs.  REVIEW OF SYSTEMS: Review of Systems  Cardiovascular:  Positive for dyspnea on exertion (chronic - not progressive). Negative for chest pain, claudication, irregular heartbeat, leg swelling, near-syncope, orthopnea, palpitations, paroxysmal nocturnal dyspnea and syncope.  Respiratory:  Positive for shortness of breath.   Hematologic/Lymphatic: Negative for bleeding problem.  Musculoskeletal:  Negative for muscle cramps and myalgias.  Neurological:  Negative for dizziness and light-headedness.    PHYSICAL EXAM:    04/29/2022   11:31 AM 03/22/2022   11:13 AM 08/28/2020    8:58 AM  Vitals with BMI   Height 5\' 11"  5\' 11"  5\' 11"   Weight 162 lbs 13 oz 162 lbs 13 oz 164 lbs 3 oz  BMI 22.72 [id number]  Systolic [id number]  Diastolic 67 65 64  Pulse 64 65 56    Physical Exam  Constitutional: No distress.  Age appropriate, hemodynamically stable.   Neck: No JVD present.  Cardiovascular: Normal rate, regular rhythm, S1 normal, S2 normal, intact distal pulses and normal pulses. Exam reveals no gallop, no S3 and no S4.  No murmur heard. Pulmonary/Chest: Effort normal and breath sounds normal. No stridor. He has no wheezes. He has no rales.  Abdominal: Soft. Bowel sounds are normal. He exhibits no distension. There is no abdominal tenderness.  Musculoskeletal:        General: No edema.     Cervical back: Neck supple.  Neurological: He is alert and oriented to person, place, and time. He has intact cranial nerves (2-12).  Skin: Skin is warm and moist.    CARDIAC DATABASE: EKG: 03/22/2022: Sinus bradycardia, 57 bpm, left axis, left anterior fascicular block, without underlying injury pattern  Today before but  Echocardiogram: 04/02/2022: Normal LV systolic function with visual EF 60-65%. Left ventricle cavity is normal in size. Normal left ventricular wall thickness. Normal global wall motion. Normal diastolic filling pattern, normal LAP.  Left atrial cavity is normal in size. The interatrial Septum is thin and mobile but appears to be intact by 2D and CF Doppler interrogation. Mild pulmonic regurgitation. No prior study for comparison.    Stress Testing: Exercise treadmill stress test 04/05/2022: Exercise treadmill stress test performed using Bruce protocol. Patient reached 10.1 METS, and 85% of age predicted maximum heart rate. Exercise capacity was good. No chest pain reported. Normal heart rate and hemodynamic response. Stress EKG revealed no ischemic changes. Low risk study.  Heart Catheterization: None  [facility]/02/[facility].  Total Calcium Score: 0.  -- Left  Main: 0.  -- Left Anterior Descending: 0.  -- Circumflex: 0.  -- Right Coronary: 0  -- Posterior Descending: 0.   - Cardiovascular: Normal heart size. No pericardial effusion.  - Mediastinum: No adenopathy.  - Visible abdomen: Unremarkable.  - Visible lung fields: 3 mm solid pulmonary nodule within the left lower lobe (series 3, image 51).  - Other soft tissues: Bilateral mild gynecomastia.   LABORATORY DATA:  Lipid Panel w/reflex Reviewed date:02/15/2022 03:40:38 PM Interpretation:LDL 112 Performing Lab: Notes/Report: Testing Performed at: [facility], [address], [address], [place], [place] [address]  Cholesterol 203 <200 mg/dL    CHOL/HDL 2.6 2.0-4.0 Ratio    HDLD 79 30-70 mg/dL Values below 40 mg/dL indicate increased risk factor  Triglyceride 62 0-199 mg/dL  NHDL 124 0-129 mg/dL Range dependent upon risk factors.  LDL Chol Calc (NIH) 112 0-99 mg/dL     Comp Metabolic Panel Reviewed 0000000 03:40:46 PM Interpretation:normal Performing Lab: Notes/Report: Testing Performed at: [facility], [address], [address], [place], [place] [address]  Glucose 81 70-99 mg/dL    BUN 23 6-26 mg/dL    Creatinine 0.98 0.60-1.30 mg/dl    [id number] 81 >60 calc In accordance with recommendations from NKF-ASN Task Force, [facility] has updated its eGFR calc to the 2021 CKD-EDI equation that estimates kidney function without a race variable;Stage 1 > 90 ML/Min plus Albuminuria;Stage 2 60-89 ML/MIN;Stage 3 30-59 ML/MIN;Stage 4 15-29 ML/MIN;Stage 5 <15 ML/MIN  Sodium 139 136-145 mmol/L    Potassium 4.8 3.5-5.5 mmol/L    Chloride 103 98-107 mmol/L    CO2 31 22-32 mmol/L    Anion Gap 9.7 6.0-20.0 mmol/L    Calcium 9.6 8.6-10.3 mg/dL    CA-corrected 9.51 8.60-10.30 mg/dL    Protein, Total 6.4 6.0-8.3 g/dL    Albumin 4.1 3.4-4.8 g/dL    TBIL 0.7 0.3-1.0 mg/dL    ALP 77 38-126 U/L    AST 37 0-39 U/L    ALT 35 0-52 U/L    CBC without Diff Reviewed date:02/15/2022 03:40:54  PM Interpretation:normal Performing Lab: Notes/Report: Testing Performed at: [facility], [address], [address], [place], [place] [address]  WBC 7.0 4.0-11.0 K/ul    RBC 5.01 4.20-5.80 M/uL    HGB 14.9 13.0-17.0 g/dL    HCT 45.2 39.0-52.0 %    MCV 90.3 80.0-94.0 fL    MCH 29.7 27.0-33.0 pg    MCHC 32.9 32.0-36.0 g/dL    RDW 13.5 11.5-15.5 %    PLT 222 150-400 K/uL    Iron, Serum Reviewed date:02/19/2022 07:39:07 AM Interpretation:normal Performing Lab: Notes/Report: same day addon Testing Performed at: [facility], [address], [address], [place], [place] [address]  Iron, serum 126 50-212 ug/dL    Magnesium, Serum [phone]) Reviewed date:02/19/2022 11:40:16 AM Interpretation:2.3 Performing Lab: Notes/Report: same day addon Testing performed at: [BN] [facility], [address] [place], [place], [address], Phone: [phone], Laboratory Director: Rush Farmer, MD  Magnesium 2.3 1.6-2.3 mg/dL     IMPRESSION:    ICD-10-CM   1. Dyspnea on exertion  R06.09     2. Worsening functional endurance  R68.89        RECOMMENDATIONS: Jason Blevins is a 72 y.o. Caucasian male who has no significant past medical history presents today for evaluation of dyspnea on exertion and reduced exercise tolerance.  Initially referred to the practice for worsening functional endurance over the last 4.5 years.  Shared decision was to proceed with echocardiogram which noted preserved LVEF without any significant valvular heart disease, GXT was low risk and he achieved 10 METS on Bruce protocol, and total coronary calcium score is 0.  Patient denies precordial pain or heart failure symptoms.  No additional cardiovascular testing warranted at this time.  I think he needs to be reevaluated from a noncardiac standpoint given his generalized tired and fatigue.  Things to consider is reevaluating thyroid function, hypogonadism, anemia, and sleep apnea.  Patient is also aware of the  pulmonary nodule noted on the coronary calcium score and he will discuss it with his pulmonologist.  If the noncardiac workup is also unremarkable and his symptoms continue we could consider additional testing such as coronary CTA for further evaluation.  However, for now we will hold off on additional testing for reasons  mentioned above.  I will see him back in 6 months for reevaluation or sooner if needed  FINAL MEDICATION LIST END OF ENCOUNTER: No orders of the defined types were placed in this encounter.   Medications Discontinued During This Encounter  Medication Reason   Turmeric 500 MG CAPS      Current Outpatient Medications:    albuterol (PROAIR HFA) 108 (90 Base) MCG/ACT inhaler, Inhale 1-2 puffs into the lungs every 4 (four) hours as needed for wheezing or shortness of breath. 1-2 puffs x 15 minutes before exercise as needed, Disp: 1 each, Rfl: 1   Ascorbic Acid (VITAMIN C WITH ROSE HIPS) 500 MG tablet, 1000/500 mg 1 by mouth twice daily, Disp: , Rfl:    BETAINE PO, Take 521 mg by mouth 2 (two) times daily., Disp: , Rfl:    Cholecalciferol 25 MCG (1000 UT) capsule, Take 2,000 Units by mouth daily., Disp: , Rfl:    DHEA 25 MG CAPS, Take 50 mg by mouth daily., Disp: , Rfl:    HYDROcodone-acetaminophen (NORCO/VICODIN) 5-325 MG tablet, Take 1 tablet by mouth every 6 (six) hours as needed for moderate pain., Disp: 15 tablet, Rfl: 0   IRON PO, 1,100 mg. 2 by mouth in the am and 1 by mouth in the pm, Disp: , Rfl:    Magnesium 250 MG TABS, Take 1 tablet by mouth daily., Disp: , Rfl:    MELATONIN PO, Take by mouth. 1-3 mg as needed, Disp: , Rfl:    UNABLE TO FIND, Med Name: Russion choice immune, 2 by mouth in the am, 2 by mouth in the pm, Disp: , Rfl:    UNABLE TO FIND, Med Name: Dicalcium Malate 150/250 mg, 1 by mouth twice daily, Disp: , Rfl:    UNABLE TO FIND, Med Name: [place] 1 gram 1 by mouth twice daily, Disp: , Rfl:    UNABLE TO FIND, Med Name: Pyridoxal 5-Phosphate 50 mg, 2  by mouth in the pm, Disp: , Rfl:    UNABLE TO FIND, Med Name: YES EFA's 740 mg 1 by mouth twice daily, Disp: , Rfl:    vitamin A 10000 UNIT capsule, Take 10,000 Units by mouth daily., Disp: , Rfl:    Multiple Vitamins-Minerals (MENS 50+ MULTIVITAMIN) TABS, as directed Orally, Disp: , Rfl:   No orders of the defined types were placed in this encounter.   There are no Patient Instructions on file for this visit.   --Continue cardiac medications as reconciled in final medication list. --Return in about 6 months (around 10/30/2022) for Follow up exercise intolerance . or sooner if needed. --Continue follow-up with your primary care physician regarding the management of your other chronic comorbid conditions.  Patient's questions and concerns were addressed to his satisfaction. He voices understanding of the instructions provided during this encounter.   This note was created using a voice recognition software as a result there may be grammatical errors inadvertently enclosed that do not reflect the nature of this encounter. Every attempt is made to correct such errors.  Rex Kras, [place], [facility]  Pager:  [phone] Office: [phone]

## 2022-05-08 DIAGNOSIS — L570 Actinic keratosis: Secondary | ICD-10-CM | POA: Diagnosis not present

## 2022-05-08 DIAGNOSIS — C44519 Basal cell carcinoma of skin of other part of trunk: Secondary | ICD-10-CM | POA: Diagnosis not present

## 2022-07-29 DIAGNOSIS — Z789 Other specified health status: Secondary | ICD-10-CM | POA: Diagnosis not present

## 2022-08-01 DIAGNOSIS — R5383 Other fatigue: Secondary | ICD-10-CM | POA: Diagnosis not present

## 2022-11-25 DIAGNOSIS — H903 Sensorineural hearing loss, bilateral: Secondary | ICD-10-CM | POA: Diagnosis not present

## 2022-11-27 ENCOUNTER — Ambulatory Visit (INDEPENDENT_AMBULATORY_CARE_PROVIDER_SITE_OTHER): Payer: HMO | Admitting: Adult Health

## 2022-11-27 ENCOUNTER — Encounter: Payer: Self-pay | Admitting: Adult Health

## 2022-11-27 VITALS — BP 132/80 | HR 67 | Temp 97.1°F | Resp 20 | Ht 71.0 in | Wt 160.0 lb

## 2022-11-27 DIAGNOSIS — R5383 Other fatigue: Secondary | ICD-10-CM | POA: Diagnosis not present

## 2022-11-27 DIAGNOSIS — R531 Weakness: Secondary | ICD-10-CM | POA: Diagnosis not present

## 2022-11-27 DIAGNOSIS — F339 Major depressive disorder, recurrent, unspecified: Secondary | ICD-10-CM

## 2022-11-27 DIAGNOSIS — R7989 Other specified abnormal findings of blood chemistry: Secondary | ICD-10-CM | POA: Diagnosis not present

## 2022-11-27 MED ORDER — SERTRALINE HCL 25 MG PO TABS: 25.0000 mg | ORAL_TABLET | Freq: Every day | ORAL | 3 refills | Status: DC

## 2022-11-27 NOTE — Progress Notes (Addendum)
[facility] clinic  Provider:  Kenard Gower DNP  Code Status:  DNR Goals of Care:     11/27/2022    1:19 PM  Advanced Directives  Does Patient Have a Medical Advance Directive? Yes  Type of Estate agent of [place];Living will  Does patient want to make changes to medical advance directive? No - Patient declined  Copy of Healthcare Power of Attorney in Chart? No - copy requested     Chief Complaint  Patient presents with   Establish Care     te reestablish care last seen at [facility] in 2021     HPI: Patient is a 72 y.o. male seen today to re-establish care with [facility]. He complained that he has been having fatigue for 4-5 years. No. He walks his 2 dogs for 2 hours everyday. He gets tired easily. He has good appetite. He stated that he feels depressed, PHQ-9 score 1. He sleeps 6-7 hours daily   Past Medical History:  Diagnosis Date   Allergy    Anxiety    Asthma    AS CHILD   Blood transfusion without reported diagnosis    Cataract    BEGINNING   Skin cancer    Per [facility] New Patient Packet    Past Surgical History:  Procedure Laterality Date   MOHS SURGERY  1994   Per [facility] New Patient Packet   SPINE SURGERY      No Known Allergies  Outpatient Encounter Medications as of 11/27/2022  Medication Sig   Ascorbic Acid (VITAMIN C WITH ROSE HIPS) 500 MG tablet 1000/500 mg 1 by mouth twice daily   BETAINE PO Take 521 mg by mouth 2 (two) times daily.   IRON PO 1,100 mg. 2 by mouth in the am and 1 by mouth in the pm   MELATONIN PO Take by mouth. 1-3 mg as needed   Multiple Vitamins-Minerals (MENS 50+ MULTIVITAMIN) TABS as directed Orally   sertraline (ZOLOFT) 25 MG tablet Take 1 tablet (25 mg total) by mouth daily.   albuterol (PROAIR HFA) 108 (90 Base) MCG/ACT inhaler Inhale 1-2 puffs into the lungs every 4 (four) hours as needed for wheezing or shortness of breath. 1-2 puffs x 15 minutes before exercise as needed (Patient not taking: Reported on  11/27/2022)   Cholecalciferol 25 MCG (1000 UT) capsule Take 2,000 Units by mouth daily. (Patient not taking: Reported on 11/27/2022)   DHEA 25 MG CAPS Take 50 mg by mouth daily. (Patient not taking: Reported on 11/27/2022)   HYDROcodone-acetaminophen (NORCO/VICODIN) 5-325 MG tablet Take 1 tablet by mouth every 6 (six) hours as needed for moderate pain. (Patient not taking: Reported on 11/27/2022)   Magnesium 250 MG TABS Take 1 tablet by mouth daily. (Patient not taking: Reported on 11/27/2022)   UNABLE TO FIND Med Name: Russion choice immune, 2 by mouth in the am, 2 by mouth in the pm (Patient not taking: Reported on 11/27/2022)   UNABLE TO FIND Med Name: Dicalcium Malate 150/250 mg, 1 by mouth twice daily (Patient not taking: Reported on 11/27/2022)   UNABLE TO FIND Med Name: Itis Care 1 gram 1 by mouth twice daily (Patient not taking: Reported on 11/27/2022)   UNABLE TO FIND Med Name: Pyridoxal 5-Phosphate 50 mg, 2 by mouth in the pm (Patient not taking: Reported on 11/27/2022)   UNABLE TO FIND Med Name: YES EFA's 740 mg 1 by mouth twice daily (Patient not taking: Reported on 11/27/2022)   vitamin A 30865 UNIT  capsule Take 10,000 Units by mouth daily. (Patient not taking: Reported on 11/27/2022)   No facility-administered encounter medications on file as of 11/27/2022.    Review of Systems:  Review of Systems  Constitutional:  Positive for fatigue. Negative for activity change, appetite change and fever.  HENT:  Negative for sore throat.   Eyes: Negative.   Cardiovascular:  Negative for chest pain and leg swelling.  Gastrointestinal:  Negative for abdominal distention, diarrhea and vomiting.  Genitourinary:  Negative for dysuria, frequency and urgency.  Skin:  Negative for color change.  Neurological:  Negative for dizziness and headaches.  Psychiatric/Behavioral:  Negative for behavioral problems and sleep disturbance. The patient is not nervous/anxious.     Health Maintenance  Topic  Date Due   Zoster Vaccines- Shingrix (2 of 2) 04/12/2022   INFLUENZA VACCINE  09/05/2022   COVID-19 Vaccine (7 - 2023-24 season) 10/06/2022   Medicare Annual Wellness (AWV)  02/16/2023   DTaP/Tdap/Td (2 - Td or Tdap) 10/02/2023   Colonoscopy  01/21/2029   Pneumonia Vaccine 51+ Years old  Completed   Hepatitis C Screening  Completed   HPV VACCINES  Aged Out    Physical Exam: Vitals:   11/27/22 1314  BP: 132/80  Pulse: 67  Resp: 20  Temp: (!) 97.1 F (36.2 C)  SpO2: 97%  Weight: 160 lb (72.6 kg)  Height: 5\' 11"  (1.803 m)   Body mass index is 22.32 kg/m. Physical Exam Constitutional:      Appearance: Normal appearance.  HENT:     Head: Normocephalic and atraumatic.     Mouth/Throat:     Mouth: Mucous membranes are moist.  Eyes:     Conjunctiva/sclera: Conjunctivae normal.  Cardiovascular:     Rate and Rhythm: Normal rate and regular rhythm.     Pulses: Normal pulses.     Heart sounds: Normal heart sounds.  Pulmonary:     Effort: Pulmonary effort is normal.     Breath sounds: Normal breath sounds.  Abdominal:     General: Bowel sounds are normal.     Palpations: Abdomen is soft.  Musculoskeletal:        General: No swelling. Normal range of motion.     Cervical back: Normal range of motion.  Skin:    General: Skin is warm and dry.  Neurological:     General: No focal deficit present.     Mental Status: He is alert and oriented to person, place, and time.  Psychiatric:        Mood and Affect: Mood normal.        Behavior: Behavior normal.        Thought Content: Thought content normal.        Judgment: Judgment normal.     Labs reviewed: Basic Metabolic Panel: No results for input(s): "NA", "K", "CL", "CO2", "GLUCOSE", "BUN", "CREATININE", "CALCIUM", "MG", "PHOS", "TSH" in the last 8760 hours. Liver Function Tests: No results for input(s): "AST", "ALT", "ALKPHOS", "BILITOT", "PROT", "ALBUMIN" in the last 8760 hours. No results for input(s): "LIPASE",  "AMYLASE" in the last 8760 hours. No results for input(s): "AMMONIA" in the last 8760 hours. CBC: No results for input(s): "WBC", "NEUTROABS", "HGB", "HCT", "MCV", "PLT" in the last 8760 hours. Lipid Panel: No results for input(s): "CHOL", "HDL", "LDLCALC", "TRIG", "CHOLHDL", "LDLDIRECT" in the last 8760 hours. No results found for: "HGBA1C"  Procedures since last visit: No results found.  Assessment/Plan  1. Episode of recurrent major depressive disorder, unspecified depression episode severity (  HCC) -  feels fatigued -  PHQ-9 score 1, ranging as minimal depression -  will start on Sertraline - sertraline (ZOLOFT) 25 MG tablet; Take 1 tablet (25 mg total) by mouth daily.  Dispense: 30 tablet; Refill: 3 - CBC with Differential/Platelets - Complete Metabolic Panel with eGFR - Hemoglobin A1C - TSH - Magnesium - Iron, TIBC and Ferritin Panel   Labs/tests ordered:  - CBC with Differential/Platelets - Complete Metabolic Panel with eGFR - Hemoglobin A1C - TSH - Magnesium - Iron, TIBC and Ferritin Panel   Next appt:  Visit date not found

## 2022-11-27 NOTE — Patient Instructions (Signed)
Preventive Care 65 Years and Older, Male Preventive care refers to lifestyle choices and visits with your health care provider that can promote health and wellness. Preventive care visits are also called wellness exams. What can I expect for my preventive care visit? Counseling During your preventive care visit, your health care provider may ask about your: Medical history, including: Past medical problems. Family medical history. History of falls. Current health, including: Emotional well-being. Home life and relationship well-being. Sexual activity. Memory and ability to understand (cognition). Lifestyle, including: Alcohol, nicotine or tobacco, and drug use. Access to firearms. Diet, exercise, and sleep habits. Work and work environment. Sunscreen use. Safety issues such as seatbelt and bike helmet use. Physical exam Your health care provider will check your: Height and weight. These may be used to calculate your BMI (body mass index). BMI is a measurement that tells if you are at a healthy weight. Waist circumference. This measures the distance around your waistline. This measurement also tells if you are at a healthy weight and may help predict your risk of certain diseases, such as type 2 diabetes and high blood pressure. Heart rate and blood pressure. Body temperature. Skin for abnormal spots. What immunizations do I need?  Vaccines are usually given at various ages, according to a schedule. Your health care provider will recommend vaccines for you based on your age, medical history, and lifestyle or other factors, such as travel or where you work. What tests do I need? Screening Your health care provider may recommend screening tests for certain conditions. This may include: Lipid and cholesterol levels. Diabetes screening. This is done by checking your blood sugar (glucose) after you have not eaten for a while (fasting). Hepatitis C test. Hepatitis B test. HIV (human  immunodeficiency virus) test. STI (sexually transmitted infection) testing, if you are at risk. Lung cancer screening. Colorectal cancer screening. Prostate cancer screening. Abdominal aortic aneurysm (AAA) screening. You may need this if you are a current or former smoker. Talk with your health care provider about your test results, treatment options, and if necessary, the need for more tests. Follow these instructions at home: Eating and drinking  Eat a diet that includes fresh fruits and vegetables, whole grains, lean protein, and low-fat dairy products. Limit your intake of foods with high amounts of sugar, saturated fats, and salt. Take vitamin and mineral supplements as recommended by your health care provider. Do not drink alcohol if your health care provider tells you not to drink. If you drink alcohol: Limit how much you have to 0-2 drinks a day. Know how much alcohol is in your drink. In the [place] one drink equals one 12 oz bottle of beer (355 mL), one 5 oz glass of wine (148 mL), or one 1 oz glass of hard liquor (44 mL). Lifestyle Brush your teeth every morning and night with fluoride toothpaste. Floss one time each day. Exercise for at least 30 minutes 5 or more days each week. Do not use any products that contain nicotine or tobacco. These products include cigarettes, chewing tobacco, and vaping devices, such as e-cigarettes. If you need help quitting, ask your health care provider. Do not use drugs. If you are sexually active, practice safe sex. Use a condom or other form of protection to prevent STIs. Take aspirin only as told by your health care provider. Make sure that you understand how much to take and what form to take. Work with your health care provider to find out whether it is safe   and beneficial for you to take aspirin daily. Ask your health care provider if you need to take a cholesterol-lowering medicine (statin). Find healthy ways to manage stress, such  as: Meditation, yoga, or listening to music. Journaling. Talking to a trusted person. Spending time with friends and family. Safety Always wear your seat belt while driving or riding in a vehicle. Do not drive: If you have been drinking alcohol. Do not ride with someone who has been drinking. When you are tired or distracted. While texting. If you have been using any mind-altering substances or drugs. Wear a helmet and other protective equipment during sports activities. If you have firearms in your house, make sure you follow all gun safety procedures. Minimize exposure to UV radiation to reduce your risk of skin cancer. What's next? Visit your health care provider once a year for an annual wellness visit. Ask your health care provider how often you should have your eyes and teeth checked. Stay up to date on all vaccines. This information is not intended to replace advice given to you by your health care provider. Make sure you discuss any questions you have with your health care provider. Document Revised: 07/19/2020 Document Reviewed: 07/19/2020 Elsevier Patient Education  2024 Elsevier Inc.  

## 2022-11-28 LAB — COMPLETE METABOLIC PANEL WITH GFR
AG Ratio: 1.6 (calc) (ref 1.0–2.5)
ALT: 32 U/L (ref 9–46)
AST: 30 U/L (ref 10–35)
Albumin: 4 g/dL (ref 3.6–5.1)
Alkaline phosphatase (APISO): 96 U/L (ref 35–144)
BUN: 22 mg/dL (ref 7–25)
CO2: 29 mmol/L (ref 20–32)
Calcium: 9.6 mg/dL (ref 8.6–10.3)
Chloride: 104 mmol/L (ref 98–110)
Creat: 1.07 mg/dL (ref 0.70–1.28)
Globulin: 2.5 g/dL (ref 1.9–3.7)
Glucose, Bld: 96 mg/dL (ref 65–139)
Potassium: 4.1 mmol/L (ref 3.5–5.3)
Sodium: 140 mmol/L (ref 135–146)
Total Bilirubin: 0.5 mg/dL (ref 0.2–1.2)
Total Protein: 6.5 g/dL (ref 6.1–8.1)
eGFR: 73 mL/min/{1.73_m2} (ref >=60)

## 2022-11-28 LAB — CBC WITH DIFFERENTIAL/PLATELET
Absolute Lymphocytes: 1849 {cells}/uL (ref 850–3900)
Absolute Monocytes: 473 {cells}/uL (ref 200–950)
Basophils Absolute: 17 {cells}/uL (ref 0–200)
Basophils Relative: 0.2 %
Eosinophils Absolute: 77 {cells}/uL (ref 15–500)
Eosinophils Relative: 0.9 %
HCT: 45.6 % (ref 38.5–50.0)
Hemoglobin: 15 g/dL (ref 13.2–17.1)
MCH: 29.9 pg (ref 27.0–33.0)
MCHC: 32.9 g/dL (ref 32.0–36.0)
MCV: 91 fL (ref 80.0–100.0)
MPV: 12.8 fL — ABNORMAL HIGH (ref 7.5–12.5)
Monocytes Relative: 5.5 %
Neutro Abs: 6183 {cells}/uL (ref 1500–7800)
Neutrophils Relative %: 71.9 %
Platelets: 221 10*3/uL (ref 140–400)
RBC: 5.01 10*6/uL (ref 4.20–5.80)
RDW: 12.3 % (ref 11.0–15.0)
Total Lymphocyte: 21.5 %
WBC: 8.6 10*3/uL (ref 3.8–10.8)

## 2022-11-28 LAB — IRON,TIBC AND FERRITIN PANEL
%SAT: 34 % (ref 20–48)
Ferritin: 68 ng/mL (ref 24–380)
Iron: 103 ug/dL (ref 50–180)
TIBC: 299 ug/dL (ref 250–425)

## 2022-11-28 LAB — MAGNESIUM: Magnesium: 2.1 mg/dL (ref 1.5–2.5)

## 2022-11-28 LAB — TSH: TSH: 1.2 m[IU]/L (ref 0.40–4.50)

## 2022-11-28 LAB — HEMOGLOBIN A1C
Hgb A1c MFr Bld: 5.6 %{Hb} (ref <5.7)
Mean Plasma Glucose: 114 mg/dL
eAG (mmol/L): 6.3 mmol/L

## 2022-12-01 NOTE — Progress Notes (Signed)
-    all labs are within normal

## 2022-12-27 ENCOUNTER — Ambulatory Visit: Payer: HMO

## 2022-12-27 ENCOUNTER — Ambulatory Visit (INDEPENDENT_AMBULATORY_CARE_PROVIDER_SITE_OTHER): Payer: HMO | Admitting: Adult Health

## 2022-12-27 ENCOUNTER — Encounter: Payer: Self-pay | Admitting: Adult Health

## 2022-12-27 VITALS — BP 124/78 | HR 57 | Temp 97.4°F | Resp 18 | Ht 71.0 in | Wt 160.0 lb

## 2022-12-27 DIAGNOSIS — E785 Hyperlipidemia, unspecified: Secondary | ICD-10-CM | POA: Diagnosis not present

## 2022-12-27 DIAGNOSIS — Z13220 Encounter for screening for lipoid disorders: Secondary | ICD-10-CM | POA: Diagnosis not present

## 2022-12-27 DIAGNOSIS — Z23 Encounter for immunization: Secondary | ICD-10-CM | POA: Diagnosis not present

## 2022-12-27 DIAGNOSIS — F339 Major depressive disorder, recurrent, unspecified: Secondary | ICD-10-CM | POA: Diagnosis not present

## 2022-12-27 MED ORDER — SERTRALINE HCL 50 MG PO TABS
50.0000 mg | ORAL_TABLET | Freq: Every day | ORAL | 1 refills | Status: DC
Start: 2022-12-27 — End: 2023-07-16

## 2022-12-27 NOTE — Progress Notes (Signed)
[name] clinic  Provider:  Kenard Gower DNP  Code Status:  DNR  Goals of Care:     11/27/2022    1:19 PM  Advanced Directives  Does Patient Have a Medical Advance Directive? Yes  Type of Estate agent of [place];Living will  Does patient want to make changes to medical advance directive? No - Patient declined  Copy of Healthcare Power of Attorney in Chart? No - copy requested     Chief Complaint  Patient presents with   Medical Management of Chronic Issues    4 week follow-up   Immunizations    Shinginx, Covid and Influenza   Health Maintenance    Medicare Annual Wellness Visit    HPI: Patient is a 72 y.o. male seen today for a medical management of chronic issues. He is a professor at [employer], communications.  Episode of recurrent major depressive disorder, unspecified depression episode severity (HCC)  -  "feels good", on Zoloft, "still a little bit depressed"  Walks his dogs twice a day.  Past Medical History:  Diagnosis Date   Allergy    Anxiety    Asthma    AS CHILD   Blood transfusion without reported diagnosis    Cataract    BEGINNING   Skin cancer    Per [facility] New Patient Packet    Past Surgical History:  Procedure Laterality Date   MOHS SURGERY  1994   Per [facility] New Patient Packet   SPINE SURGERY      No Known Allergies  Outpatient Encounter Medications as of 12/27/2022  Medication Sig   Ascorbic Acid (VITAMIN C WITH ROSE HIPS) 500 MG tablet 1000/500 mg 1 by mouth twice daily   BETAINE PO Take 521 mg by mouth 2 (two) times daily.   IRON PO 1,100 mg. 2 by mouth in the am and 1 by mouth in the pm   MELATONIN PO Take by mouth. 1-3 mg as needed   Multiple Vitamins-Minerals (MENS 50+ MULTIVITAMIN) TABS as directed Orally   sertraline (ZOLOFT) 25 MG tablet Take 1 tablet (25 mg total) by mouth daily.   UNABLE TO FIND Med Name: Itis Care 1 gram 1 by mouth twice daily (Patient not taking: Reported on 11/27/2022)    vitamin A 96295 UNIT capsule Take 10,000 Units by mouth daily. (Patient not taking: Reported on 11/27/2022)   No facility-administered encounter medications on file as of 12/27/2022.    Review of Systems:  Review of Systems  Constitutional:  Negative for activity change, appetite change and fever.  HENT:  Negative for sore throat.   Eyes: Negative.   Cardiovascular:  Negative for chest pain and leg swelling.  Gastrointestinal:  Negative for abdominal distention, diarrhea and vomiting.  Genitourinary:  Negative for dysuria, frequency and urgency.  Skin:  Negative for color change.  Neurological:  Negative for dizziness and headaches.  Psychiatric/Behavioral:  Negative for behavioral problems and sleep disturbance. The patient is not nervous/anxious.     Health Maintenance  Topic Date Due   Zoster Vaccines- Shingrix (2 of 2) 04/12/2022   INFLUENZA VACCINE  09/05/2022   Medicare Annual Wellness (AWV)  02/16/2023   COVID-19 Vaccine (9 - 2023-24 season) 01/03/2023   DTaP/Tdap/Td (2 - Td or Tdap) 10/02/2023   Colonoscopy  01/21/2029   Pneumonia Vaccine 74+ Years old  Completed   Hepatitis C Screening  Completed   HPV VACCINES  Aged Out    Physical Exam: Vitals:   12/27/22 1320  BP: 124/78  Pulse: (!) 57  Resp: 18  Temp: (!) 97.4 F (36.3 C)  SpO2: 96%  Weight: 160 lb (72.6 kg)  Height: 5\' 11"  (1.803 m)   Body mass index is 22.32 kg/m. Physical Exam Constitutional:      General: He is not in acute distress.    Appearance: Normal appearance.  HENT:     Head: Normocephalic and atraumatic.     Mouth/Throat:     Mouth: Mucous membranes are moist.  Eyes:     Conjunctiva/sclera: Conjunctivae normal.  Cardiovascular:     Rate and Rhythm: Normal rate and regular rhythm.     Pulses: Normal pulses.     Heart sounds: Normal heart sounds.  Pulmonary:     Effort: Pulmonary effort is normal.     Breath sounds: Normal breath sounds.  Abdominal:     General: Bowel sounds are  normal.     Palpations: Abdomen is soft.  Musculoskeletal:        General: No swelling. Normal range of motion.     Cervical back: Normal range of motion.  Skin:    General: Skin is warm and dry.  Neurological:     General: No focal deficit present.     Mental Status: He is alert and oriented to person, place, and time.  Psychiatric:        Mood and Affect: Mood normal.        Behavior: Behavior normal.        Thought Content: Thought content normal.        Judgment: Judgment normal.    Labs reviewed: Basic Metabolic Panel: Recent Labs    11/27/22 1347  NA 140  K 4.1  CL 104  CO2 29  GLUCOSE 96  BUN 22  CREATININE 1.07  CALCIUM 9.6  MG 2.1  TSH 1.20   Liver Function Tests: Recent Labs    11/27/22 1347  AST 30  ALT 32  BILITOT 0.5  PROT 6.5   No results for input(s): "LIPASE", "AMYLASE" in the last 8760 hours. No results for input(s): "AMMONIA" in the last 8760 hours. CBC: Recent Labs    11/27/22 1347  WBC 8.6  NEUTROABS 6,183  HGB 15.0  HCT 45.6  MCV 91.0  PLT 221   Lipid Panel: No results for input(s): "CHOL", "HDL", "LDLCALC", "TRIG", "CHOLHDL", "LDLDIRECT" in the last 8760 hours. Lab Results  Component Value Date   HGBA1C 5.6 11/27/2022    Procedures since last visit: No results found.  Assessment/Plan  1. Episode of recurrent major depressive disorder, unspecified depression episode severity (HCC) -  stated that he feels better but still a little bit depressed -  PHQ-9 score 0 -  will increase Zoloft 25 mg to 50 mg daily - sertraline (ZOLOFT) 50 MG tablet; Take 1 tablet (50 mg total) by mouth daily.  Dispense: 90 tablet; Refill: 1  2. Flu vaccine need - Flu Vaccine Trivalent High Dose (Fluad)  3. Screening for hyperlipidemia -  walks his dogs twice a day - Lipid panel   Labs/tests ordered:  lipid panel  Next appt:  12/27/2022

## 2022-12-28 LAB — LIPID PANEL
Cholesterol: 198 mg/dL (ref <200)
HDL: 76 mg/dL (ref >=40)
LDL Cholesterol (Calc): 103 mg/dL — ABNORMAL HIGH
Non-HDL Cholesterol (Calc): 122 mg/dL (ref <130)
Total CHOL/HDL Ratio: 2.6 (calc) (ref <5.0)
Triglycerides: 93 mg/dL (ref <150)

## 2022-12-31 ENCOUNTER — Telehealth: Payer: Self-pay

## 2022-12-31 NOTE — Telephone Encounter (Signed)
Patient was calling to update shingles immunization

## 2023-01-07 NOTE — Progress Notes (Signed)
-   LDL 103, slightly elevated, normal <100 -  cholesterol and triglycerides, no new orders

## 2023-07-16 ENCOUNTER — Ambulatory Visit (INDEPENDENT_AMBULATORY_CARE_PROVIDER_SITE_OTHER): Admitting: Adult Health

## 2023-07-16 ENCOUNTER — Encounter: Payer: Self-pay | Admitting: Adult Health

## 2023-07-16 VITALS — BP 110/72 | HR 70 | Ht 71.0 in | Wt 166.2 lb

## 2023-07-16 DIAGNOSIS — M545 Low back pain, unspecified: Secondary | ICD-10-CM

## 2023-07-16 DIAGNOSIS — E7800 Pure hypercholesterolemia, unspecified: Secondary | ICD-10-CM

## 2023-07-16 DIAGNOSIS — Z125 Encounter for screening for malignant neoplasm of prostate: Secondary | ICD-10-CM

## 2023-07-16 DIAGNOSIS — F339 Major depressive disorder, recurrent, unspecified: Secondary | ICD-10-CM

## 2023-07-16 DIAGNOSIS — D509 Iron deficiency anemia, unspecified: Secondary | ICD-10-CM

## 2023-07-16 DIAGNOSIS — F5101 Primary insomnia: Secondary | ICD-10-CM | POA: Diagnosis not present

## 2023-07-16 DIAGNOSIS — G8929 Other chronic pain: Secondary | ICD-10-CM

## 2023-07-16 MED ORDER — HYDROCODONE-ACETAMINOPHEN 5-325 MG PO TABS: 0.5000 | ORAL_TABLET | Freq: Two times a day (BID) | ORAL | 0 refills | Status: AC | PRN

## 2023-07-16 NOTE — Progress Notes (Signed)
 [facility] clinic  Provider:  Inge Mangle DNP  Code Status:  DNR  Goals of Care:     11/27/2022    1:19 PM  Advanced Directives  Does Patient Have a Medical Advance Directive? Yes  Type of Estate agent of [place];Living will  Does patient want to make changes to medical advance directive? No - Patient declined  Copy of Healthcare Power of Attorney in Chart? No - copy requested     Chief Complaint  Patient presents with   Follow-up    8 month follow up. Would to discuss medication   Discussed the use of AI scribe software for clinical note transcription with the patient, who gave verbal consent to proceed.  HPI: Patient is a 73 y.o. male seen today for an 23-month follow up of chronic medical issues.     Past Medical History:  Diagnosis Date   Allergy    Anxiety    Asthma    AS CHILD   Blood transfusion without reported diagnosis    Cataract    BEGINNING   Skin cancer    Per [facility] New Patient Packet    Past Surgical History:  Procedure Laterality Date   MOHS SURGERY  1994   Per [facility] New Patient Packet   SPINE SURGERY      No Known Allergies  Outpatient Encounter Medications as of 07/16/2023  Medication Sig   Ascorbic Acid (VITAMIN C WITH ROSE HIPS) 500 MG tablet 1000/500 mg 1 by mouth twice daily   HYDROcodone -acetaminophen  (NORCO/VICODIN) 5-325 MG tablet Take 0.5 tablets by mouth 2 (two) times daily as needed for moderate pain (pain score 4-6).   IRON PO 1,100 mg. 2 by mouth in the am and 1 by mouth in the pm   MELATONIN PO Take by mouth. 1-3 mg as needed   Multiple Vitamins-Minerals (MENS 50+ MULTIVITAMIN) TABS as directed Orally   BETAINE PO Take 521 mg by mouth 2 (two) times daily. (Patient not taking: Reported on 07/16/2023)   [DISCONTINUED] sertraline  (ZOLOFT ) 50 MG tablet Take 1 tablet (50 mg total) by mouth daily. (Patient not taking: Reported on 07/16/2023)   No facility-administered encounter medications on file as of  07/16/2023.    Review of Systems:  Review of Systems  Constitutional:  Negative for activity change, appetite change and fever.  HENT:  Negative for sore throat.   Eyes: Negative.   Cardiovascular:  Negative for chest pain and leg swelling.  Gastrointestinal:  Negative for abdominal distention, diarrhea and vomiting.  Genitourinary:  Negative for dysuria, frequency and urgency.  Skin:  Negative for color change.  Neurological:  Negative for dizziness and headaches.  Psychiatric/Behavioral:  Negative for behavioral problems and sleep disturbance. The patient is not nervous/anxious.     Health Maintenance  Topic Date Due   Medicare Annual Wellness (AWV)  02/16/2023   COVID-19 Vaccine (9 - Pfizer risk 2024-25 season) 12/04/2023 (Originally 05/09/2023)   INFLUENZA VACCINE  09/05/2023   DTaP/Tdap/Td (2 - Td or Tdap) 10/02/2023   Colonoscopy  01/21/2029   Pneumonia Vaccine 31+ Years old  Completed   Hepatitis C Screening  Completed   Zoster Vaccines- Shingrix  Completed   HPV VACCINES  Aged Out   Meningococcal B Vaccine  Aged Out    Physical Exam: Vitals:   07/16/23 0945  BP: 110/72  Pulse: 70  SpO2: 98%  Weight: 166 lb 3.2 oz (75.4 kg)  Height: 5' 11 (1.803 m)   Body mass index is 23.18  kg/m. Physical Exam Constitutional:      Appearance: Normal appearance.  HENT:     Head: Normocephalic and atraumatic.     Mouth/Throat:     Mouth: Mucous membranes are moist.  Eyes:     Conjunctiva/sclera: Conjunctivae normal.  Cardiovascular:     Rate and Rhythm: Normal rate and regular rhythm.     Pulses: Normal pulses.     Heart sounds: Normal heart sounds.  Pulmonary:     Effort: Pulmonary effort is normal.     Breath sounds: Normal breath sounds.  Abdominal:     General: Bowel sounds are normal.     Palpations: Abdomen is soft.  Musculoskeletal:        General: No swelling. Normal range of motion.     Cervical back: Normal range of motion.  Skin:    General: Skin is warm  and dry.  Neurological:     General: No focal deficit present.     Mental Status: He is alert and oriented to person, place, and time.  Psychiatric:        Mood and Affect: Mood normal.        Behavior: Behavior normal.        Thought Content: Thought content normal.        Judgment: Judgment normal.     Labs reviewed: Basic Metabolic Panel: Recent Labs    11/27/22 1347  NA 140  K 4.1  CL 104  CO2 29  GLUCOSE 96  BUN 22  CREATININE 1.07  CALCIUM 9.6  MG 2.1  TSH 1.20   Liver Function Tests: Recent Labs    11/27/22 1347  AST 30  ALT 32  BILITOT 0.5  PROT 6.5   No results for input(s): LIPASE, AMYLASE in the last 8760 hours. No results for input(s): AMMONIA in the last 8760 hours. CBC: Recent Labs    11/27/22 1347  WBC 8.6  NEUTROABS 6,183  HGB 15.0  HCT 45.6  MCV 91.0  PLT 221   Lipid Panel: Recent Labs    12/27/22 1355  CHOL 198  HDL 76  LDLCALC 103*  TRIG 93  CHOLHDL 2.6   Lab Results  Component Value Date   HGBA1C 5.6 11/27/2022    Procedures since last visit: No results found.  Assessment/Plan  1. Iron deficiency anemia, unspecified iron deficiency anemia type (Primary) -  Iron deficiency anemia previously treated with iron supplements and betaine HCL pepsin. Hemoglobin levels normal in last test. Continued supplementation to be evaluated based on current labs. - Order CBC to assess hemoglobin levels. - Evaluate need for continued iron supplementation based on CBC results.  2. Elevated LDL cholesterol level Lab Results  Component Value Date   CHOL 198 12/27/2022   HDL 76 12/27/2022   LDLCALC 103 (H) 12/27/2022   TRIG 93 12/27/2022   CHOLHDL 2.6 12/27/2022    -  Slightly elevated LDL cholesterol at 103 mg/dL. Repeat lipid panel needed. - Order lipid panel to reassess LDL cholesterol levels. - Lipid panel  3. Primary insomnia -  continue Melatonin 1 mg at bedtime PRN - Complete Metabolic Panel with eGFR  4. Chronic  midline low back pain without sciatica -  Chronic low back pain post-surgery, managed with infrequent hydrocodone  use. Pain up to 5/10 during exacerbations. Narcotic contract necessary. - Prescribe hydrocodone  5/325 mg, 0.5 tablet as needed, 90 tablets for over a year. - Establish narcotic contract for hydrocodone  use. - HYDROcodone -acetaminophen  (NORCO/VICODIN) 5-325 MG tablet; Take 0.5 tablets  by mouth 2 (two) times daily as needed for moderate pain (pain score 4-6).  Dispense: 90 tablet; Refill: 0  5. Screening for prostate cancer -  no urinary concerns   - PSA  6. Mild depression -  Mild depression with PHQ-9 score of 2. Previously on sertraline , now discontinued. Reports feeling healthy and engaged in writing. - Discontinue sertraline .    Assessment and Plan    Iron Deficiency Anemia    General Health Maintenance Generally healthy, exercises regularly, balanced diet, up to date with vaccinations, moderate alcohol use, non-smoker. Due for PSA test and Medicare wellness visit. - Order PSA test. - Schedule Medicare annual wellness visit. - Encourage continued healthy lifestyle.  Follow-up Scheduled follow-up to review lab results and assess iron supplementation need. - Schedule follow-up in six months to review lab results and health status.        Labs/tests ordered:  CBC, CMP, lipid panel PSA  Return in about 6 months (around 01/15/2024).  Laurieann Friddle Medina-Vargas, NP

## 2023-07-18 ENCOUNTER — Telehealth: Payer: Self-pay

## 2023-07-18 NOTE — Telephone Encounter (Signed)
 Received information regarding sending PA for pt medication, after entering information in, covermymeds stated the medication didn't need any prior authorization. Called patients pharmacy regarding the medication HYDROcodone -acetaminophen . Insurance only covered 5 days instead of the full 30 days patient paid with cash for medication. Everything was all set regarding patients medication.

## 2023-07-20 ENCOUNTER — Ambulatory Visit: Payer: Self-pay | Admitting: Adult Health

## 2023-07-20 NOTE — Progress Notes (Signed)
-    cholesterol 207, elevated, up from 198 -   Triglycerides normal -LDL 105, elevated, up from 103 -   Recommending 150 minutes/week exercise, low-fat diet, increase vegetable intake -   No anemia -   Electrolytes, liver enzymes normal - PSA normal

## 2023-07-21 IMAGING — DX DG CHEST 2V
2 series · 2 of 2 positions shown · non-contrast
Comparison: Two-view chest x-ray 06/12/2020

CLINICAL DATA: Prolonged cough for 1 month.

EXAM:
CHEST - 2 VIEW

[dg chest 2 view (1 of 2)]
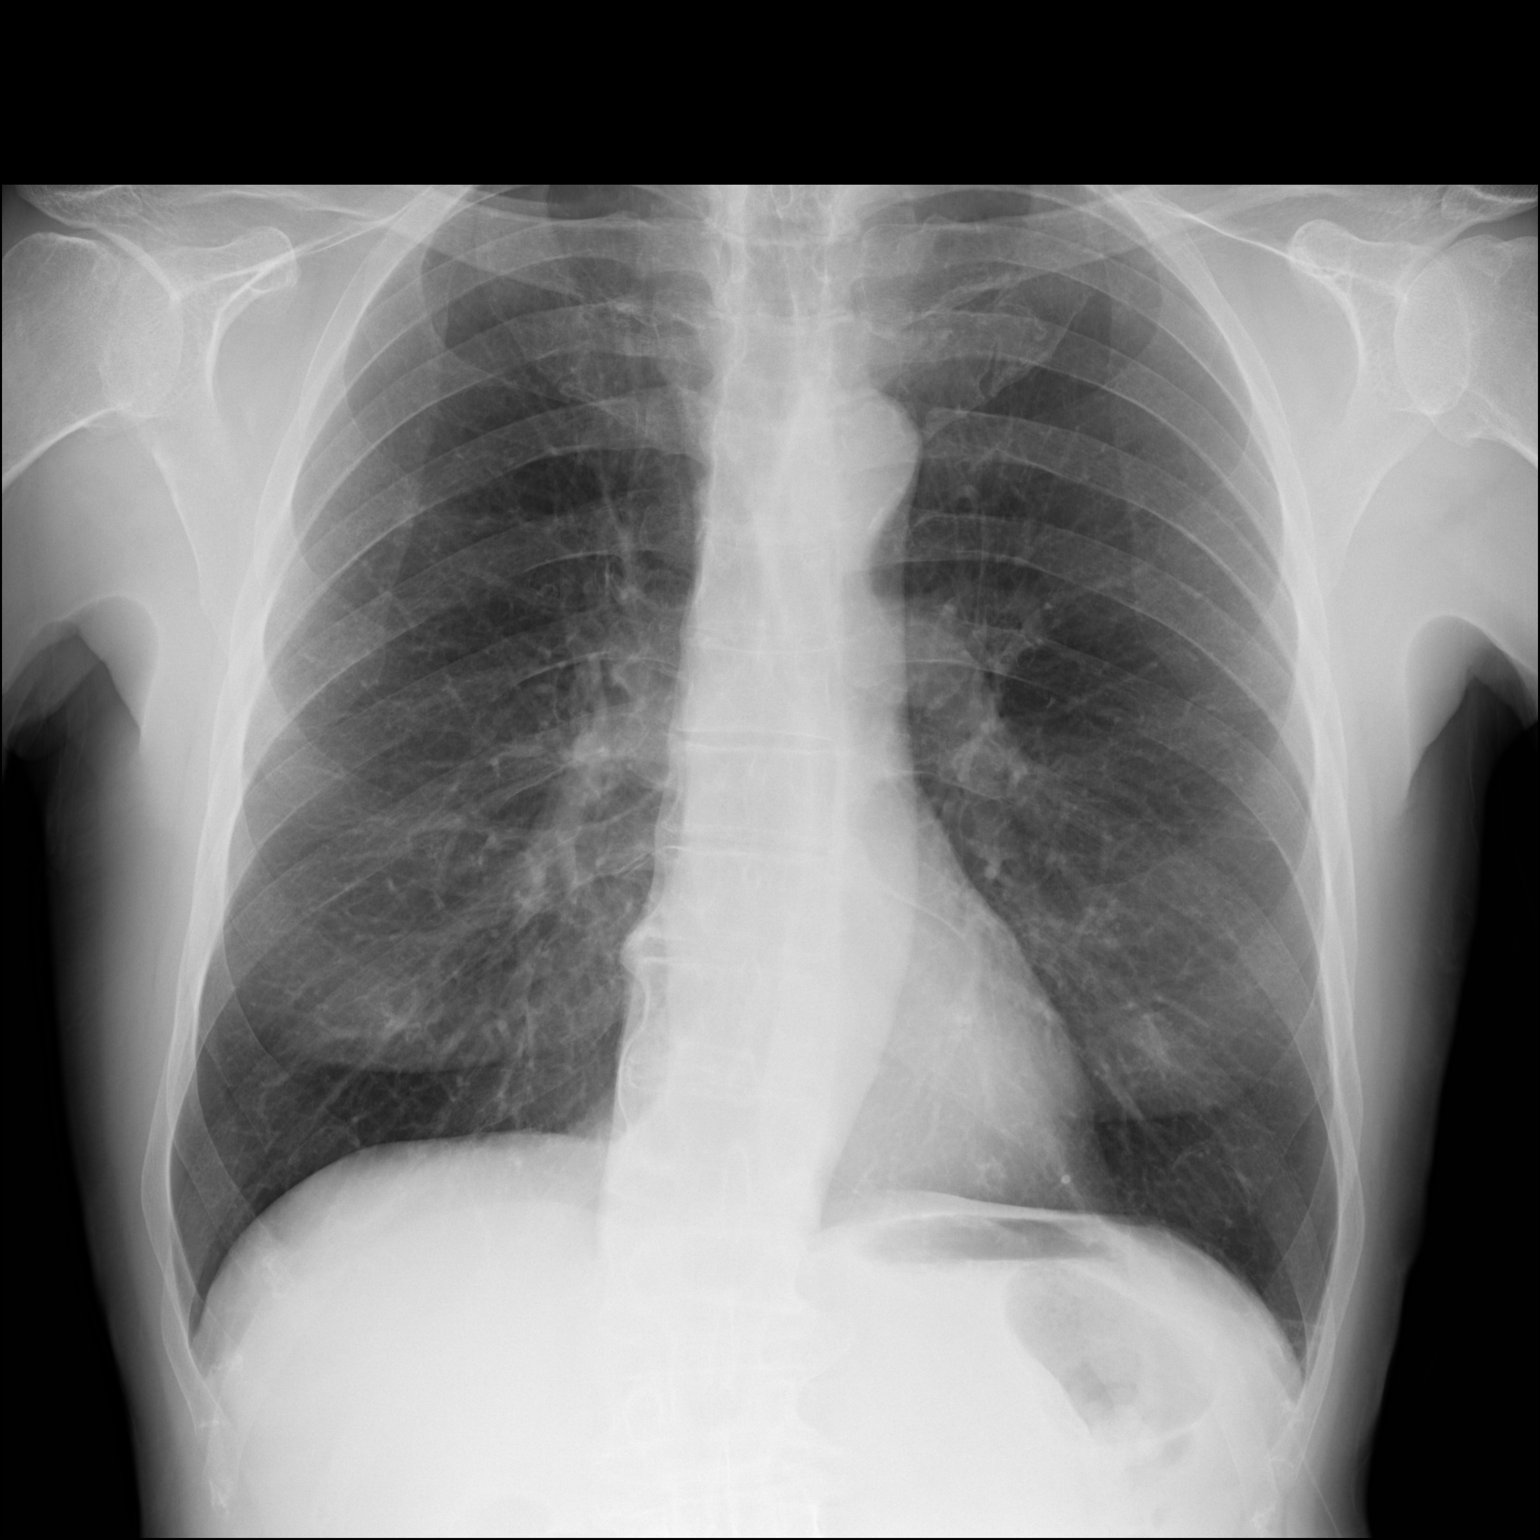

[dg chest 2 view (2 of 2)]
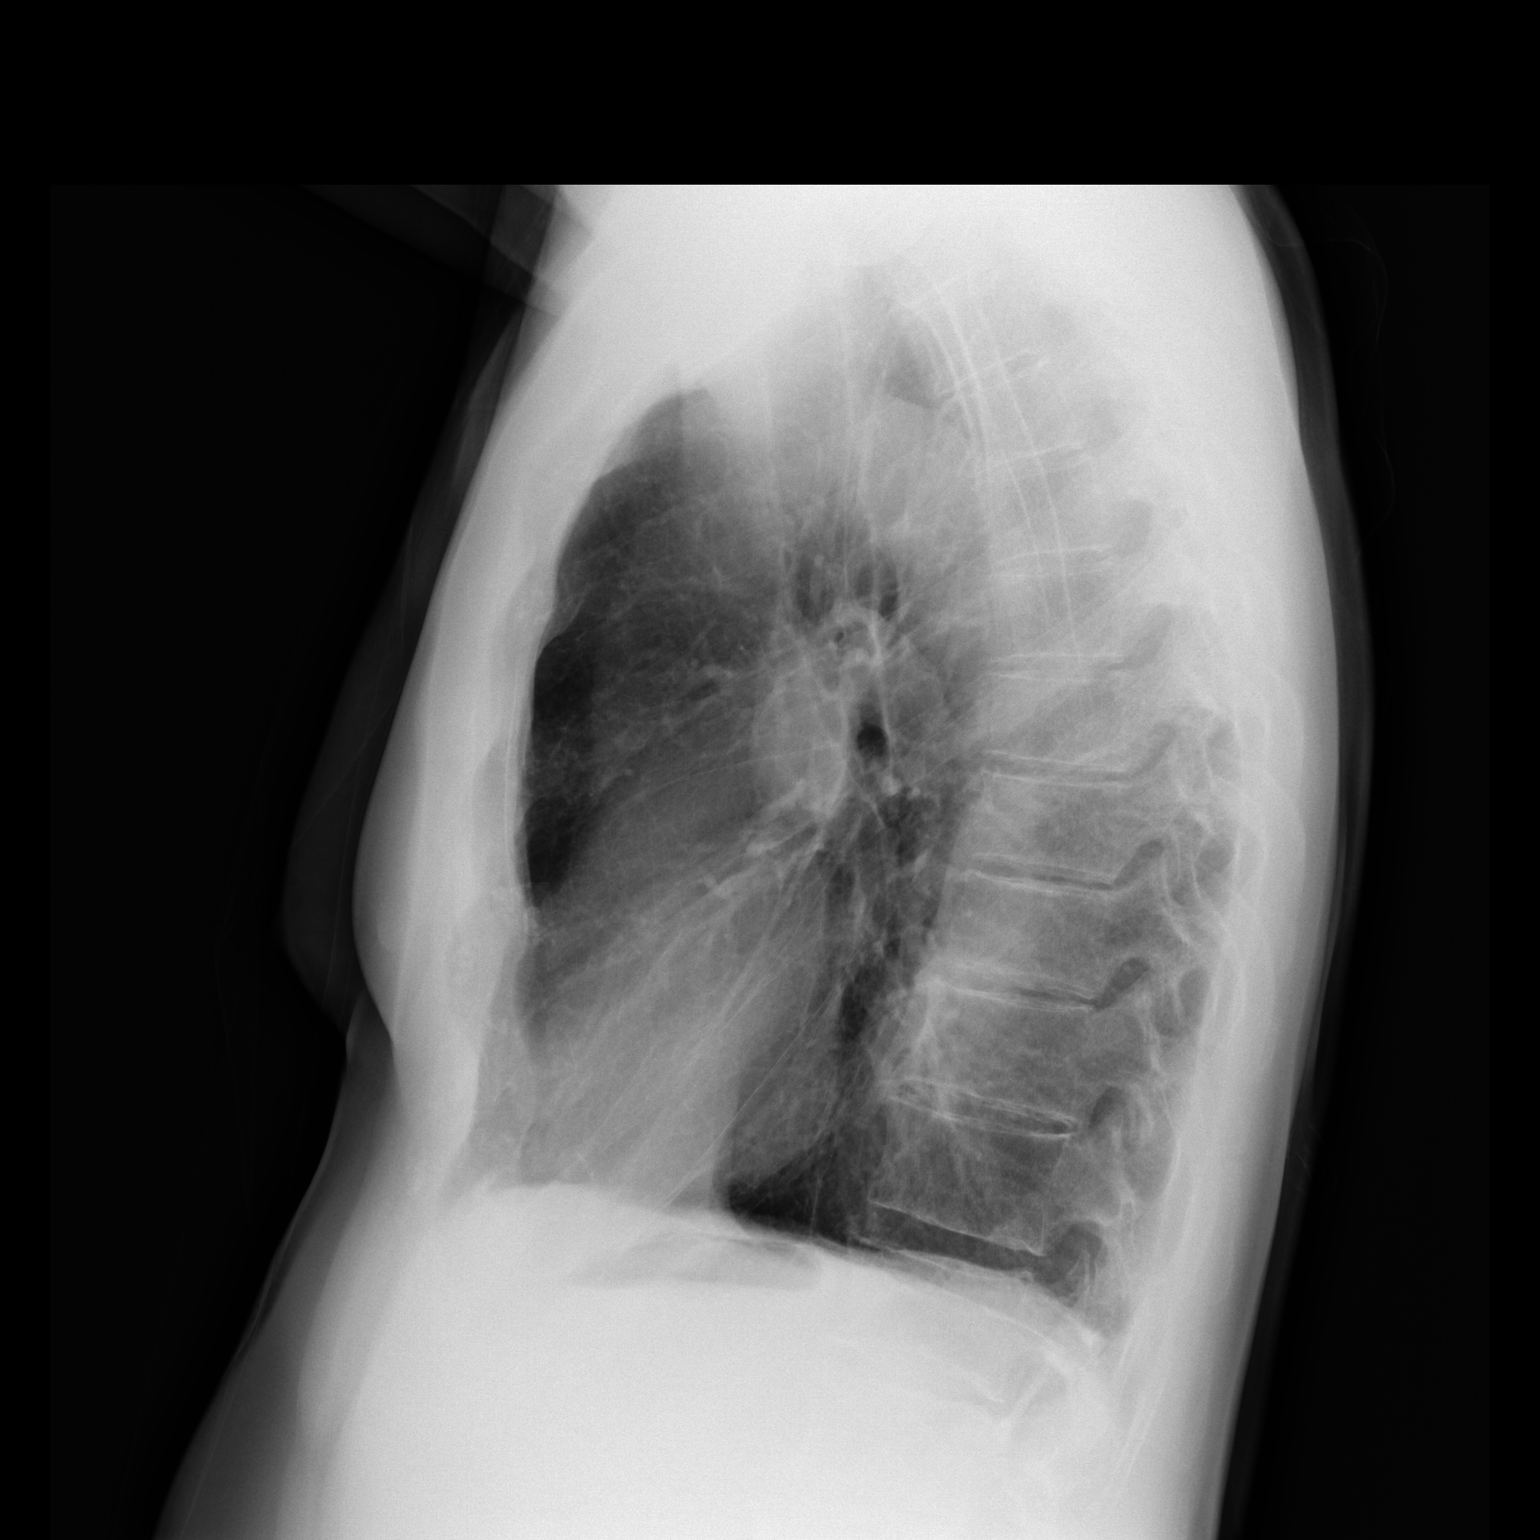

[2 of 2 positions shown; findings below may reference images not displayed]

FINDINGS: Heart size is normal. Changes of COPD noted. No focal nodule or mass
lesion is present. No airspace disease is present. No significant
edema or pleural effusion is present.
IMPRESSION: 1. COPD.
2. No acute cardiopulmonary disease.

## 2023-09-24 DIAGNOSIS — L821 Other seborrheic keratosis: Secondary | ICD-10-CM | POA: Diagnosis not present

## 2023-09-24 DIAGNOSIS — L570 Actinic keratosis: Secondary | ICD-10-CM | POA: Diagnosis not present

## 2023-12-11 ENCOUNTER — Ambulatory Visit (INDEPENDENT_AMBULATORY_CARE_PROVIDER_SITE_OTHER): Payer: Self-pay | Admitting: Adult Health

## 2023-12-11 ENCOUNTER — Encounter: Payer: Self-pay | Admitting: Adult Health

## 2023-12-11 VITALS — BP 126/54 | HR 83 | Temp 98.1°F | Ht 71.0 in | Wt 169.2 lb

## 2023-12-11 DIAGNOSIS — D509 Iron deficiency anemia, unspecified: Secondary | ICD-10-CM | POA: Diagnosis not present

## 2023-12-11 DIAGNOSIS — Z0000 Encounter for general adult medical examination without abnormal findings: Secondary | ICD-10-CM

## 2023-12-11 DIAGNOSIS — E7800 Pure hypercholesterolemia, unspecified: Secondary | ICD-10-CM

## 2023-12-11 LAB — COMPREHENSIVE METABOLIC PANEL WITH GFR
AG Ratio: 1.9 (calc) (ref 1.0–2.5)
ALT: 39 U/L (ref 9–46)
AST: 36 U/L — ABNORMAL HIGH (ref 10–35)
Albumin: 4.3 g/dL (ref 3.6–5.1)
Alkaline phosphatase (APISO): 82 U/L (ref 35–144)
BUN: 18 mg/dL (ref 7–25)
CO2: 33 mmol/L — ABNORMAL HIGH (ref 20–32)
Calcium: 9.7 mg/dL (ref 8.6–10.3)
Chloride: 101 mmol/L (ref 98–110)
Creat: 0.94 mg/dL (ref 0.70–1.28)
Globulin: 2.3 g/dL (ref 1.9–3.7)
Glucose, Bld: 107 mg/dL (ref 65–139)
Potassium: 4.5 mmol/L (ref 3.5–5.3)
Sodium: 140 mmol/L (ref 135–146)
Total Bilirubin: 0.6 mg/dL (ref 0.2–1.2)
Total Protein: 6.6 g/dL (ref 6.1–8.1)
eGFR: 85 mL/min/1.73m2 (ref >=60)

## 2023-12-11 LAB — CBC WITH DIFFERENTIAL/PLATELET
Absolute Lymphocytes: 1656 {cells}/uL (ref 850–3900)
Absolute Monocytes: 624 {cells}/uL (ref 200–950)
Basophils Absolute: 32 {cells}/uL (ref 0–200)
Basophils Relative: 0.4 %
Eosinophils Absolute: 120 {cells}/uL (ref 15–500)
Eosinophils Relative: 1.5 %
HCT: 45.1 % (ref 38.5–50.0)
Hemoglobin: 15 g/dL (ref 13.2–17.1)
MCH: 29.5 pg (ref 27.0–33.0)
MCHC: 33.3 g/dL (ref 32.0–36.0)
MCV: 88.8 fL (ref 80.0–100.0)
MPV: 12.2 fL (ref 7.5–12.5)
Monocytes Relative: 7.8 %
Neutro Abs: 5568 {cells}/uL (ref 1500–7800)
Neutrophils Relative %: 69.6 %
Platelets: 221 Thousand/uL (ref 140–400)
RBC: 5.08 Million/uL (ref 4.20–5.80)
RDW: 12.4 % (ref 11.0–15.0)
Total Lymphocyte: 20.7 %
WBC: 8 Thousand/uL (ref 3.8–10.8)

## 2023-12-11 LAB — LIPID PANEL
Cholesterol: 208 mg/dL — ABNORMAL HIGH (ref <200)
HDL: 77 mg/dL (ref >=40)
LDL Cholesterol (Calc): 113 mg/dL — ABNORMAL HIGH
Non-HDL Cholesterol (Calc): 131 mg/dL — ABNORMAL HIGH (ref <130)
Total CHOL/HDL Ratio: 2.7 (calc) (ref <5.0)
Triglycerides: 85 mg/dL (ref <150)

## 2023-12-11 NOTE — Progress Notes (Signed)
 Subjective:   Jason Blevins is a 73 y.o. male who presents for a Medicare Annual Wellness Visit.  Allergies (verified) Patient has no known allergies.   History: Past Medical History:  Diagnosis Date   Allergy    Anxiety    Asthma    AS CHILD   Blood transfusion without reported diagnosis    Cataract    BEGINNING   Skin cancer    Per [facility] New Patient Packet   Past Surgical History:  Procedure Laterality Date   MOHS SURGERY  1994   Per [facility] New Patient Packet   SPINE SURGERY     Family History  Problem Relation Age of Onset   Cancer Mother        unsure what type    Arthritis Sister    Cancer Sister 44       skin   Heart disease Maternal Grandfather    Colon cancer Neg Hx    Colon polyps Neg Hx    Esophageal cancer Neg Hx    Rectal cancer Neg Hx    Stomach cancer Neg Hx    Other Neg Hx        low testosterone    Social History   Occupational History   Not on file  Tobacco Use   Smoking status: Never   Smokeless tobacco: Never  Vaping Use   Vaping status: Never Used  Substance and Sexual Activity   Alcohol use: Yes    Comment: 2-3 weekly    Drug use: No   Sexual activity: Yes   Tobacco Counseling Counseling given: Not Answered  SDOH Screenings   Food Insecurity: No Food Insecurity (12/11/2023)  Housing: Low Risk  (12/11/2023)  Transportation Needs: No Transportation Needs (12/11/2023)  Utilities: Not At Risk (12/11/2023)  Alcohol Screen: Low Risk  (01/06/2020)  Depression (PHQ2-9): Low Risk  (12/11/2023)  Physical Activity: Sufficiently Active (12/11/2023)  Social Connections: Moderately Integrated (12/11/2023)  Stress: Stress Concern Present (12/11/2023)  Tobacco Use: Low Risk  (12/11/2023)  Health Literacy: Adequate Health Literacy (12/11/2023)   Depression Screen    12/11/2023   10:32 AM 07/16/2023    9:59 AM 07/16/2023    9:58 AM 07/16/2023    9:57 AM 12/27/2022    1:19 PM 11/27/2022    1:12 PM 01/06/2020   10:04 AM  PHQ 2/9 Scores  PHQ - 2  Score 1 1 1 1  0 1 2  PHQ- 9 Score  2  2   0   6      Data saved with a previous flowsheet row definition     Goals Addressed             This Visit's Progress    Exercise 3x per week (30 min per time)       - will do pus ups at home -  takes 28 walks a week, 2 hours each walk       Visit info / Clinical Intake: Medicare Wellness Visit Type:: Subsequent Annual Wellness Visit Medicare Wellness Visit Mode:: In-person (required for WTM) Interpreter Needed?: No Pre-visit prep was completed: no AWV questionnaire completed by patient prior to visit?: no Living arrangements:: (!) lives alone Patient's Overall Health Status Rating: good Typical amount of pain: some Does pain affect daily life?: no Are you currently prescribed opioids?: no  Dietary Habits and Nutritional Risks How many meals a day?: 2 Eats fruit and vegetables daily?: yes Most meals are obtained by: preparing own meals Diabetic:: no  Functional Status Activities of Daily Living (to include ambulation/medication): Independent Ambulation: Independent Medication Administration: Independent Home Management: Independent Manage your own finances?: yes Primary transportation is: driving Concerns about vision?: no *vision screening is required for WTM* Concerns about hearing?: no  Fall Screening Falls in the past year?: 0 Number of falls in past year: 0 Was there an injury with Fall?: 0 Fall Risk Category Calculator: 0 Patient Fall Risk Level: Low Fall Risk  Fall Risk Patient at Risk for Falls Due to: No Fall Risks Fall risk Follow up: Falls evaluation completed  Home and Transportation Safety: All rugs have non-skid backing?: (!) no All stairs or steps have railings?: yes Grab bars in the bathtub or shower?: (!) no Have non-skid surface in bathtub or shower?: yes Good home lighting?: yes Regular seat belt use?: yes Hospital stays in the last year:: no  Cognitive Assessment Difficulty concentrating,  remembering, or making decisions? : no Will 6CIT or Mini Cog be Completed: yes What year is it?: 0 points What month is it?: 0 points Give patient an address phrase to remember (5 components): [address] [facility] About what time is it?: 0 points Count backwards from 20 to 1: 0 points Say the months of the year in reverse: 0 points Repeat the address phrase from earlier: 0 points 6 CIT Score: 0 points  Advance Directives (For Healthcare) Does Patient Have a Medical Advance Directive?: Yes Does patient want to make changes to medical advance directive?: No - Patient declined Type of Advance Directive: Living will Copy of Healthcare Power of Attorney in Chart?: Yes - validated most recent copy scanned in chart (See row information) Copy of Living Will in Chart?: Yes - validated most recent copy scanned in chart (See row information)        Objective:    Today's Vitals   12/11/23 1043  BP: (!) 126/54  Pulse: 83  Temp: 98.1 F (36.7 C)  TempSrc: Temporal  SpO2: 95%  Weight: 169 lb 3.2 oz (76.7 kg)  Height: 5' 11 (1.803 m)   Body mass index is 23.6 kg/m.  Current Medications (verified) Outpatient Encounter Medications as of 12/11/2023  Medication Sig   Ascorbic Acid (VITAMIN C WITH ROSE HIPS) 500 MG tablet 1000/500 mg 1 by mouth twice daily   BETAINE PO Take 521 mg by mouth 2 (two) times daily.   HYDROcodone -acetaminophen  (NORCO/VICODIN) 5-325 MG tablet Take 0.5 tablets by mouth 2 (two) times daily as needed for moderate pain (pain score 4-6).   IRON PO 1,100 mg. 2 by mouth in the am and 1 by mouth in the pm   Magnesium 250 MG TABS Take 250 mg by mouth as needed.   MELATONIN PO Take by mouth. 1-3 mg as needed   Multiple Vitamins-Minerals (MENS 50+ MULTIVITAMIN) TABS as directed Orally   No facility-administered encounter medications on file as of 12/11/2023.   Hearing/Vision screen Vision Screening   Right eye Left eye Both eyes  Without correction 20/25 20/20 20/20    With correction     Comments: Last vision exam over a year.  Hearing Screening - Comments:: No hearing difficulty Immunizations and Health Maintenance Health Maintenance  Topic Date Due   Influenza Vaccine  09/05/2023   DTaP/Tdap/Td (2 - Td or Tdap) 10/02/2023   COVID-19 Vaccine (9 - 2025-26 season) 10/06/2023   Medicare Annual Wellness (AWV)  12/10/2024   Colonoscopy  01/21/2029   Pneumococcal Vaccine: 50+ Years  Completed   Hepatitis C Screening  Completed  Zoster Vaccines- Shingrix  Completed   Meningococcal B Vaccine  Aged Out   Hepatitis B Vaccines 19-59 Average Risk  Discontinued        Assessment/Plan:  This is a routine wellness examination for Rocky.  Patient Care Team: Medina-Vargas, Karesa Maultsby C, NP as PCP - General (Internal Medicine) Robinson Mayo, OD as Referring Physician (Optometry) Darlean Ozell NOVAK, MD as Consulting Physician (Pulmonary Disease)  I have personally reviewed and noted the following in the patient's chart:   Medical and social history Use of alcohol, tobacco or illicit drugs  Current medications and supplements including opioid prescriptions. Functional ability and status Nutritional status Physical activity Advanced directives List of other physicians Hospitalizations, surgeries, and ER visits in previous 12 months Vitals Screenings to include cognitive, depression, and falls Referrals and appointments  No orders of the defined types were placed in this encounter.  In addition, I have reviewed and discussed with patient certain preventive protocols, quality metrics, and best practice recommendations. A written personalized care plan for preventive services as well as general preventive health recommendations were provided to patient.   Monta Police Medina-Vargas, NP   12/11/2023   Return in 1 year (on 12/10/2024).  After Visit Summary: (In Person-Printed) AVS printed and given to the patient  Nurse Notes:  Please schedule your next year's  annual wellness visit.

## 2023-12-11 NOTE — Patient Instructions (Signed)
  Jason Blevins , Thank you for taking time to come for your Medicare Wellness Visit. I appreciate your ongoing commitment to your health goals. Please review the following plan we discussed and let me know if I can assist you in the future.   These are the goals we discussed:  Goals      Exercise 3x per week (30 min per time)     - will do pus ups at home -  takes 28 walks a week, 2 hours each walk     Increase physical activity     Increase activity to 30 mins daily      Patient Stated     Increase activity        This is a list of the screening recommended for you and due dates:  Health Maintenance  Topic Date Due   Flu Shot  09/05/2023   DTaP/Tdap/Td vaccine (2 - Td or Tdap) 10/02/2023   COVID-19 Vaccine (9 - 2025-26 season) 10/06/2023   Medicare Annual Wellness Visit  12/10/2024   Colon Cancer Screening  01/21/2029   Pneumococcal Vaccine for age over 16  Completed   Hepatitis C Screening  Completed   Zoster (Shingles) Vaccine  Completed   Meningitis B Vaccine  Aged Out   Hepatitis B Vaccine  Discontinued

## 2023-12-12 ENCOUNTER — Ambulatory Visit: Payer: Self-pay | Admitting: Adult Health

## 2023-12-12 LAB — CBC WITH DIFFERENTIAL/PLATELET
Absolute Lymphocytes: 2482 {cells}/uL (ref 850–3900)
Absolute Monocytes: 621 {cells}/uL (ref 200–950)
Basophils Absolute: 43 {cells}/uL (ref 0–200)
Basophils Relative: 0.5 %
Eosinophils Absolute: 119 {cells}/uL (ref 15–500)
Eosinophils Relative: 1.4 %
HCT: 45.2 % (ref 38.5–50.0)
Hemoglobin: 15.1 g/dL (ref 13.2–17.1)
MCH: 29.8 pg (ref 27.0–33.0)
MCHC: 33.4 g/dL (ref 32.0–36.0)
MCV: 89.3 fL (ref 80.0–100.0)
MPV: 12.1 fL (ref 7.5–12.5)
Monocytes Relative: 7.3 %
Neutro Abs: 5236 {cells}/uL (ref 1500–7800)
Neutrophils Relative %: 61.6 %
Platelets: 233 Thousand/uL (ref 140–400)
RBC: 5.06 Million/uL (ref 4.20–5.80)
RDW: 12.7 % (ref 11.0–15.0)
Total Lymphocyte: 29.2 %
WBC: 8.5 Thousand/uL (ref 3.8–10.8)

## 2023-12-12 LAB — COMPLETE METABOLIC PANEL WITHOUT GFR
AG Ratio: 1.8 (calc) (ref 1.0–2.5)
ALT: 25 U/L (ref 9–46)
AST: 29 U/L (ref 10–35)
Albumin: 4.2 g/dL (ref 3.6–5.1)
Alkaline phosphatase (APISO): 90 U/L (ref 35–144)
BUN: 23 mg/dL (ref 7–25)
CO2: 29 mmol/L (ref 20–32)
Calcium: 9.9 mg/dL (ref 8.6–10.3)
Chloride: 102 mmol/L (ref 98–110)
Creat: 0.91 mg/dL (ref 0.70–1.28)
Globulin: 2.4 g/dL (ref 1.9–3.7)
Glucose, Bld: 89 mg/dL (ref 65–139)
Potassium: 4.3 mmol/L (ref 3.5–5.3)
Sodium: 141 mmol/L (ref 135–146)
Total Bilirubin: 0.5 mg/dL (ref 0.2–1.2)
Total Protein: 6.6 g/dL (ref 6.1–8.1)

## 2023-12-12 LAB — LIPID PANEL
Cholesterol: 207 mg/dL — ABNORMAL HIGH (ref <200)
HDL: 83 mg/dL (ref >=40)
LDL Cholesterol (Calc): 105 mg/dL — ABNORMAL HIGH
Non-HDL Cholesterol (Calc): 124 mg/dL (ref <130)
Total CHOL/HDL Ratio: 2.5 (calc) (ref <5.0)
Triglycerides: 94 mg/dL (ref <150)

## 2023-12-12 LAB — PSA: PSA: 1.06 ng/mL (ref <=4.00)

## 2023-12-12 NOTE — Progress Notes (Signed)
-    cholesterol 208, up from 207, still slightly elevated -  LDL- 113, up from 105, would you like to start on low dose statin? -  ast 36 (normal 10-35), elevated by 1 point

## 2024-01-14 DIAGNOSIS — F458 Other somatoform disorders: Secondary | ICD-10-CM | POA: Diagnosis not present

## 2024-01-14 DIAGNOSIS — L309 Dermatitis, unspecified: Secondary | ICD-10-CM | POA: Diagnosis not present

## 2024-01-14 DIAGNOSIS — D485 Neoplasm of uncertain behavior of skin: Secondary | ICD-10-CM | POA: Diagnosis not present

## 2024-02-26 ENCOUNTER — Telehealth: Payer: Self-pay

## 2024-02-26 NOTE — Telephone Encounter (Signed)
 Spoke with patient stating that he would like to get blood done because I have explained that to the patient you are do for a six month follow-up in the office but he reiterate that he would like to get it done before the appointment to see Medina-Vargas, Monina C, NP.    Message sent to Medina-Vargas, Monina C, NP

## 2024-02-26 NOTE — Telephone Encounter (Signed)
 Left a detail voicemail for the patient in regards of the response from Medina-Vargas, Monina C, NP.  E2C2 it is okay to share the response of the provider with the patient. If patient has any questions or concerns please have the patient to call the office at [phone]

## 2024-02-26 NOTE — Telephone Encounter (Signed)
 We can discuss the labs on your visit. Currently, there is no pending labs that needs to be done.

## 2024-02-26 NOTE — Telephone Encounter (Signed)
 Copied from CRM #[id number]. Topic: Clinical - Request for Lab/Test Order >> Feb 26, 2024  2:40 PM Susanna ORN wrote: Reason for CRM: Patient called to request an appt to have blood work done. States last time he came, his cholesterol was high so he's requesting lab orders to be placed. Wants to see if what he's been doing is working. Please give patient a call back to get him scheduled once orders have been placed. CB #: [id number].

## 2024-02-27 NOTE — Telephone Encounter (Signed)
 Please advise   Copied from CRM #[id number]. Topic: General - Other >> Feb 26, 2024  4:45 PM DeAngela L wrote: Reason for CRM: patient states he wants Lab work done to see if what he is doing is working and if not then he will go else where   Patient num  [phone]

## 2024-02-29 ENCOUNTER — Other Ambulatory Visit: Payer: Self-pay | Admitting: Adult Health

## 2024-02-29 DIAGNOSIS — E782 Mixed hyperlipidemia: Secondary | ICD-10-CM

## 2024-02-29 DIAGNOSIS — R748 Abnormal levels of other serum enzymes: Secondary | ICD-10-CM

## 2024-02-29 NOTE — Telephone Encounter (Signed)
 I have ordered liver panel and lipid panel.

## 2024-03-02 NOTE — Telephone Encounter (Signed)
 Spoke with patient and schedule a lab appointment for next Wednesday

## 2024-03-10 ENCOUNTER — Other Ambulatory Visit

## 2024-03-10 DIAGNOSIS — R748 Abnormal levels of other serum enzymes: Secondary | ICD-10-CM

## 2024-03-10 DIAGNOSIS — E782 Mixed hyperlipidemia: Secondary | ICD-10-CM

## 2024-03-10 LAB — LIPID PANEL
Cholesterol: 180 mg/dL
HDL: 77 mg/dL
LDL Cholesterol (Calc): 88 mg/dL
Non-HDL Cholesterol (Calc): 103 mg/dL
Total CHOL/HDL Ratio: 2.3 (calc)
Triglycerides: 66 mg/dL

## 2024-03-10 LAB — HEPATIC FUNCTION PANEL
AG Ratio: 1.8 (calc) (ref 1.0–2.5)
ALT: 31 U/L (ref 9–46)
AST: 37 U/L — ABNORMAL HIGH (ref 10–35)
Albumin: 4.4 g/dL (ref 3.6–5.1)
Alkaline phosphatase (APISO): 84 U/L (ref 35–144)
Bilirubin, Direct: 0.2 mg/dL (ref 0.0–0.2)
Globulin: 2.5 g/dL (ref 1.9–3.7)
Indirect Bilirubin: 0.7 mg/dL (ref 0.2–1.2)
Total Bilirubin: 0.9 mg/dL (ref 0.2–1.2)
Total Protein: 6.9 g/dL (ref 6.1–8.1)

## 2024-03-11 ENCOUNTER — Ambulatory Visit: Payer: Self-pay | Admitting: Adult Health

## 2024-03-11 NOTE — Progress Notes (Signed)
-    AST 37, up from 36 (done 3 months ago) slightly elevated with normal level 10-35, will continue to monitor. Do you drink alcohol? -  lipid panel normal, improved!!

## 2024-03-12 ENCOUNTER — Telehealth: Payer: Self-pay

## 2024-03-12 NOTE — Telephone Encounter (Signed)
 Copied from CRM [id number]. Topic: Clinical - Lab/Test Results >> Mar 12, 2024 11:22 AM DeAngela L wrote: Reason for CRM: patient states he got the lab results and will think about calling back to schedule a annual appointment

## 2024-03-12 NOTE — Telephone Encounter (Signed)
 Noted.
